# Patient Record
Sex: Male | Born: 1935 | Race: White | Hispanic: No | Smoking: Former smoker
Health system: Southern US, Community
[De-identification: ages and names within clinical notes are randomized; demographics above are authoritative.]

## PROBLEM LIST (undated history)

## (undated) DIAGNOSIS — M1990 Unspecified osteoarthritis, unspecified site: Secondary | ICD-10-CM

## (undated) DIAGNOSIS — N400 Enlarged prostate without lower urinary tract symptoms: Secondary | ICD-10-CM

## (undated) DIAGNOSIS — I219 Acute myocardial infarction, unspecified: Secondary | ICD-10-CM

## (undated) DIAGNOSIS — R251 Tremor, unspecified: Secondary | ICD-10-CM

## (undated) DIAGNOSIS — F419 Anxiety disorder, unspecified: Secondary | ICD-10-CM

## (undated) DIAGNOSIS — F0390 Unspecified dementia without behavioral disturbance: Secondary | ICD-10-CM

## (undated) HISTORY — PX: CHOLECYSTECTOMY: SHX55

## (undated) HISTORY — PX: SKIN BIOPSY: SHX1

## (undated) HISTORY — DX: Anxiety disorder, unspecified: F41.9

## (undated) HISTORY — DX: Unspecified osteoarthritis, unspecified site: M19.90

## (undated) HISTORY — PX: OTHER SURGICAL HISTORY: SHX169

## (undated) HISTORY — DX: Acute myocardial infarction, unspecified: I21.9

## (undated) HISTORY — DX: Tremor, unspecified: R25.1

## (undated) HISTORY — DX: Unspecified dementia, unspecified severity, without behavioral disturbance, psychotic disturbance, mood disturbance, and anxiety: F03.90

## (undated) HISTORY — DX: Benign prostatic hyperplasia without lower urinary tract symptoms: N40.0

---

## 2004-03-07 ENCOUNTER — Other Ambulatory Visit: Payer: Self-pay

## 2004-03-07 ENCOUNTER — Inpatient Hospital Stay: Payer: Self-pay | Admitting: Internal Medicine

## 2004-03-08 ENCOUNTER — Other Ambulatory Visit: Payer: Self-pay

## 2004-04-29 ENCOUNTER — Ambulatory Visit: Payer: Self-pay | Admitting: Internal Medicine

## 2005-04-23 ENCOUNTER — Ambulatory Visit: Payer: Self-pay | Admitting: Rheumatology

## 2005-05-02 ENCOUNTER — Ambulatory Visit: Payer: Self-pay | Admitting: Internal Medicine

## 2005-12-01 ENCOUNTER — Ambulatory Visit: Payer: Self-pay | Admitting: Internal Medicine

## 2005-12-08 ENCOUNTER — Ambulatory Visit: Payer: Self-pay | Admitting: Internal Medicine

## 2005-12-23 ENCOUNTER — Ambulatory Visit: Payer: Self-pay | Admitting: Internal Medicine

## 2006-02-18 ENCOUNTER — Ambulatory Visit: Payer: Self-pay | Admitting: Internal Medicine

## 2006-02-22 ENCOUNTER — Ambulatory Visit: Payer: Self-pay | Admitting: Internal Medicine

## 2006-04-22 ENCOUNTER — Ambulatory Visit: Payer: Self-pay | Admitting: Internal Medicine

## 2006-04-24 ENCOUNTER — Ambulatory Visit: Payer: Self-pay | Admitting: Internal Medicine

## 2008-01-05 ENCOUNTER — Emergency Department: Payer: Self-pay | Admitting: Emergency Medicine

## 2008-01-05 ENCOUNTER — Other Ambulatory Visit: Payer: Self-pay

## 2008-05-30 ENCOUNTER — Ambulatory Visit: Payer: Self-pay | Admitting: Family Medicine

## 2008-05-30 DIAGNOSIS — F411 Generalized anxiety disorder: Secondary | ICD-10-CM | POA: Insufficient documentation

## 2008-05-31 ENCOUNTER — Encounter (INDEPENDENT_AMBULATORY_CARE_PROVIDER_SITE_OTHER): Payer: Self-pay | Admitting: *Deleted

## 2008-05-31 ENCOUNTER — Encounter: Payer: Self-pay | Admitting: Family Medicine

## 2008-06-05 ENCOUNTER — Encounter: Payer: Self-pay | Admitting: Family Medicine

## 2008-06-26 ENCOUNTER — Encounter: Payer: Self-pay | Admitting: Family Medicine

## 2009-01-14 ENCOUNTER — Ambulatory Visit: Payer: Self-pay | Admitting: Specialist

## 2009-06-25 ENCOUNTER — Ambulatory Visit: Payer: Self-pay | Admitting: Family Medicine

## 2009-06-25 DIAGNOSIS — G252 Other specified forms of tremor: Secondary | ICD-10-CM

## 2009-06-25 DIAGNOSIS — R030 Elevated blood-pressure reading, without diagnosis of hypertension: Secondary | ICD-10-CM

## 2009-06-25 DIAGNOSIS — G250 Essential tremor: Secondary | ICD-10-CM

## 2009-06-25 DIAGNOSIS — M159 Polyosteoarthritis, unspecified: Secondary | ICD-10-CM | POA: Insufficient documentation

## 2009-09-10 ENCOUNTER — Ambulatory Visit: Payer: Self-pay | Admitting: Family Medicine

## 2009-10-29 ENCOUNTER — Ambulatory Visit: Payer: Self-pay | Admitting: Family Medicine

## 2009-12-31 ENCOUNTER — Encounter (INDEPENDENT_AMBULATORY_CARE_PROVIDER_SITE_OTHER): Payer: Self-pay | Admitting: *Deleted

## 2010-03-12 ENCOUNTER — Ambulatory Visit: Payer: Self-pay | Admitting: Family Medicine

## 2010-06-21 ENCOUNTER — Emergency Department: Payer: Self-pay | Admitting: Emergency Medicine

## 2010-06-21 ENCOUNTER — Encounter: Payer: Self-pay | Admitting: Family Medicine

## 2010-06-24 NOTE — Assessment & Plan Note (Signed)
Summary: 6 MTH FU/CLE   Vital Signs:  Patient profile:   77 year old male Weight:      155 pounds Temp:     97.6 degrees F oral Pulse rate:   84 / minute Pulse rhythm:   regular BP sitting:   140 / 80  (left arm) Cuff size:   regular  Vitals Entered By: Sydell Axon LPN (June 25, 2009 2:32 PM) CC: 6 Month follow-up   History of Present Illness: Pt here for a six month followup, having driven here and almost gotten lost due to forgetting the way. He admits memory is a challenge for him and he somewhat limits his driving but also redily admits that he is not ready to give up the independence of driving.  He otherwise has no complaints except for the chronic ess tremor he has had from before his wife died many years ago. He would like to try medication for that as as far as he knows, he has never been tried on any medication.  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 1 Tablet Twice A Day By Mouth 2)  Detrol La 4 Mg Xr24h-Cap (Tolterodine Tartrate) .Marland Kitchen.. 1 Daily By Mouth 3)  Flomax 0.4 Mg Xr24h-Cap (Tamsulosin Hcl) .Marland Kitchen.. 1 Daily By Mouth 4)  Lodine .... 400mg   Twice A Day  Allergies: No Known Drug Allergies  Physical Exam  General:  Well-developed,well-nourished,in no acute distress; alert,appropriate and cooperative throughout examination Head:  Normocephalic and atraumatic without obvious abnormalities. No apparent alopecia or balding. Eyes:  Conjunctiva clear bilaterally.  Ears:  External ear exam shows no significant lesions or deformities.  Otoscopic examination reveals clear canals, tympanic membranes are intact bilaterally without bulging, retraction, inflammation or discharge. Hearing is grossly normal bilaterally. Nose:  Septum  deviated slightly left with narrowed nares bilat L>R. Clear of  mucous. Mouth:  Oral mucosa and oropharynx without lesions or exudates.  Teeth in good repair. Neck:  No deformities, masses, or tenderness noted. Chest Wall:  No deformities, masses, tenderness or gynecomastia noted. Lungs:  Normal respiratory effort, chest expands symmetrically. Lungs are clear to auscultation, no crackles or wheezes. Heart:  Normal rate and regular rhythm. S1 and S2 normal without gallop, murmur, click, rub or other extra sounds. Neurologic:  Inattention tremor bilat L>R.   Impression & Recommendations:  Problem # 1:  ELEVATED BP READING WITHOUT DX HYPERTENSION (ICD-796.2) Assessment New  Had elevated BP last time as well. Stop Lodine to see if could be causing Elevation.   BP today: 140/80 Prior BP: 160/80 (05/30/2008)  Instructed in low sodium diet (DASH Handout) and behavior modification.    Problem # 2:  OSTEOARTHRITIS, MULTIPLE JOINTS (ICD-715.89) Assessment: Unchanged  Since stopping Etodolac, try Tyl ES 2 three times a day regularly. The following medications were removed from the medication list:    Etodolac 400 Mg Tabs (Etodolac) .Marland Kitchen... Take one by mouth two times a day His updated medication list for this problem includes:    Tylenol Extra Strength 500 Mg Tabs (Acetaminophen) .Marland Kitchen... 2 tabs by mouth three times a day  Discussed use of medications, application of heat or cold, and exercises.   Problem # 3:  TREMOR, ESSENTIAL (ICD-333.1) Assessment: Unchanged Trial of Sinemet. Will start  low.Marland Kitchen..10/100 two times a day.  Problem # 4:  DEMENTIA (ICD-294.8) Assessment: Unchanged Discussed my suggestion of stopping driving which he is not willing to do. Will try medication next time if all else stable.  Complete Medication List: 1)  Alprazolam 0.25 Mg Tabs (Alprazolam) .Marland Kitchen.. 1 tablet twice a day by mouth 2)  Detrol La 4 Mg Xr24h-cap (Tolterodine tartrate) .Marland Kitchen.. 1 daily by mouth 3)  Flomax 0.4  Mg Xr24h-cap (Tamsulosin hcl) .Marland Kitchen.. 1 daily by mouth 4)  Cetirizine Hcl 10 Mg Tabs (Cetirizine hcl) .... As needed 5)  Sinemet 10-100 Mg Tabs (Carbidopa-levodopa) .... One tab by mouth two times a day, may incr to three times a day if needed. 6)  Tylenol Extra Strength 500 Mg Tabs (Acetaminophen) .... 2 tabs by mouth three times a day  Patient Instructions: 1)  RTC 2 mos for recheck. 2)  Stop Lodine to see if BP improves.  3)  Start Tyl ES 2 tabs three times a day for arthritis. 4)  Start Sinemet 10/100 for Tremor. Prescriptions: SINEMET 10-100 MG TABS (CARBIDOPA-LEVODOPA) one tab by mouth two times a day, may incr to three times a day if needed.  #90 x 12   Entered and Authorized by:   Shaune Leeks MD   Signed by:   Shaune Leeks MD on 06/25/2009   Method used:   Electronically to        [facility] [phone]* (retail)       [address] [place], [place]  [address]       Ph: [phone]       Fax: [phone]   RxID:   [id number]   Current Allergies (reviewed today): No known allergies

## 2010-06-24 NOTE — Letter (Signed)
Summary: Nadara Eaton letter  [place] [facility]  [address] [place], [place] [address]   Phone: [phone]  Fax: [phone]       12/31/2009 MRN: [id number]  [facility] [address] [place], [place]  [address]  Dear Mr. [facility],  [facility] [place], and [facility] announce the retirement of Arta Silence, M.D., from full-time practice at the [facility] office effective November 21, 2009 and his plans of returning part-time.  It is important to Dr. Hetty Ely and to our practice that you understand that [facility] has seven physicians in our office for your health care needs.  We will continue to offer the same exceptional care that you have today.    Dr. Hetty Ely has spoken to many of you about his plans for retirement and returning part-time in the fall.   We will continue to work with you through the transition to schedule appointments for you in the office and meet the high standards that [facility] is committed to.   Again, it is with great pleasure that we share the news that Dr. Hetty Ely will return to [facility] in October of 2011 with a reduced schedule.    If you have any questions, or would like to request an appointment with one of our physicians, please call us at [phone] and press the option for Scheduling an appointment.  We take pleasure in providing you with excellent patient care and look forward to seeing you at your next office visit.  Our [facility] Physicians are:  Tillman Abide, M.D. Laurita Quint, M.D. Roxy Manns, M.D. Kerby Nora, M.D. Hannah Beat, M.D. Ruthe Mannan, M.D. We proudly welcomed Raechel Ache, M.D. and Eustaquio Boyden, M.D. to the practice in July/August 2011.  Sincerely,  [facility]

## 2010-06-24 NOTE — Assessment & Plan Note (Signed)
Summary: F/U/CLE   Vital Signs:  Patient profile:   77 year old male Weight:      159.25 pounds Temp:     97.0 degrees F oral Pulse rate:   88 / minute Pulse rhythm:   irregular BP sitting:   120 / 68  (left arm) Cuff size:   regular  Vitals Entered By: Sydell Axon LPN (March 12, 2010 11:57 AM) CC: follow-up visit   History of Present Illness: Pt here for recheck. He realizes he needs a hearing aid and drops and breaks them easily and theyt are expensive and can't afford to replace. He is looking into a place in [place] that helps people afford aids better. Hearing is his biggesst problem. Tremor is a nuisance but he deals with it well. His is the attention variety so signing his name is somewhat of a problem. He deals with it ok. He is still active. He still drives but emphasizes that he is very careful.  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 1 Daily By Mouth 2)  Flomax 0.4 Mg Xr24h-Cap (Tamsulosin Hcl) .Marland Kitchen.. 1 Daily By Mouth 3)  Cetirizine Hcl 10 Mg Tabs (Cetirizine Hcl) .... As Needed 4)  Tylenol Extra Strength 500 Mg Tabs (Acetaminophen) .... 2 Tabs By Mouth Three Times A Day 5)  Etodolac 400 Mg Tabs (Etodolac) .... One Tab By Mouth Two Times A Day  Allergies: 1)  ! Penicillin V Potassium (Penicillin V Potassium)  Physical Exam  General:  Well-developed,well-nourished,in no acute distress; alert,appropriate and cooperative throughout examination Head:  Normocephalic and atraumatic without obvious abnormalities. No apparent alopecia or balding. Eyes:  Conjunctiva clear  bilaterally.  Ears:  External ear exam shows no significant lesions or deformities.  Otoscopic examination reveals clear canals, tympanic membranes are intact bilaterally without bulging, retraction, inflammation or discharge. Hearing is grossly normal bilaterally. Nose:  Septum deviated slightly left with narrowed nares bilat L>R. Clear of  mucous. Mouth:  Oral mucosa and oropharynx without lesions or exudates.  Teeth in good repair. Neck:  No deformities, masses, or tenderness noted. Chest Wall:  No deformities, masses, tenderness or gynecomastia noted. Lungs:  Normal respiratory effort, chest expands symmetrically. Lungs are clear to auscultation, no crackles or wheezes. Heart:  Normal rate and regular rhythm. S1 and S2 normal without gallop, murmur, click, rub or other extra sounds. Abdomen:  Bowel sounds positive,abdomen soft and non-tender without masses, organomegaly or hernias noted. Neurologic:  Inattention tremor bilat L>R. Minimal today. Gait nml but slow. Psych:  Cognition and judgment appear intact. Alert and cooperative with normal attention span and concentration. No apparent delusions, illusions, hallucinations   Impression & Recommendations:  Problem # 1:  ELEVATED BP READING WITHOUT DX HYPERTENSION (ICD-796.2) Assessment Unchanged Conts nml. BP today: 120/68 Prior BP: 120/60 (10/29/2009)  Instructed in low sodium diet (DASH Handout) and behavior modification.    Problem # 2:  TREMOR, ESSENTIAL (ICD-333.1) Assessment: Unchanged Stable off Sinemet which made him drowsy.  Problem # 3:  DEMENTIA (ICD-294.8) Assessment: Unchanged Realizes he doesn't remember things and has a system for noting dates and appts.  Problem # 4:  BENIGN PROSTATIC HYPERTROPHY, HX OF (ICD-V13.8) Assessment: Unchanged Urination well controlled. Cont curr meds.  Complete Medication List: 1)  Detrol La 4 Mg Xr24h-cap (Tolterodine tartrate) .Marland Kitchen.. 1 daily by mouth 2)  Flomax 0.4 Mg Xr24h-cap  (Tamsulosin hcl) .Marland Kitchen.. 1 daily by mouth 3)  Cetirizine Hcl 10 Mg Tabs (Cetirizine hcl) .... As needed 4)  Tylenol Extra Strength 500 Mg Tabs (Acetaminophen) .... 2 tabs by mouth three times a day 5)  Etodolac 400 Mg Tabs (Etodolac) .... One tab by mouth two times a day  Other Orders: Flu Vaccine 61yrs + MEDICARE PATIENTS (Y6063) Administration Flu vaccine - MCR (K1601)  Patient Instructions: 1)  RTC  6 mos, fasting labs prior.   Orders Added: 1)  Est. Patient Level III [09323] 2)  Flu Vaccine 64yrs + MEDICARE PATIENTS [Q2039] 3)  Administration Flu vaccine - MCR [G0008]    Current Allergies (reviewed today): ! PENICILLIN V POTASSIUM (PENICILLIN V POTASSIUM)   Flu Vaccine Consent Questions     Do you have a history of severe allergic reactions to this vaccine? no    Any prior history of allergic reactions to egg and/or gelatin? no    Do you have a sensitivity to the preservative Thimersol? no    Do you have a past history of Guillan-Barre Syndrome? no    Do you currently have an acute febrile illness? no    Have you ever had a severe reaction to latex? no    Vaccine information given and explained to patient? yes    Are you currently pregnant? no    Lot Number:[id number]   Exp Date:11/22/2010   Site Given  Left Deltoid [email or web address]

## 2010-06-24 NOTE — Assessment & Plan Note (Signed)
Summary: RESCHEDULE 4/5 APPT/DLO   Vital Signs:  Patient profile:   77 year old male Weight:      154.75 pounds Temp:     98.1 degrees F oral Pulse rate:   80 / minute Pulse rhythm:   irregular BP sitting:   120 / 60  (left arm) Cuff size:   regular  Vitals Entered By: Sydell Axon LPN (September 10, 2009 11:37 AM) CC: Follow-up on medication, medication is making him sleep a lot   History of Present Illness: Pt here because he is sleeping a lot. He thinks this is consistent with when he started taking the Sinemet for tremor. He also does not think his tremor is much better and is not inclined to continue the medication.  He is very active at home and cannot afford to be abler to do the normal work of keeping his house up because of somnolence. He otherwise is doing well and feels good. He is very active and does alll of the work around the house including cooking and cleaning.  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 1 Tablet Twice A Day By Mouth 2)  Detrol La 4 Mg Xr24h-Cap (Tolterodine Tartrate) .Marland Kitchen.. 1 Daily By Mouth 3)  Flomax 0.4 Mg Xr24h-Cap (Tamsulosin Hcl) .Marland Kitchen.. 1 Daily By Mouth 4)  Cetirizine Hcl 10 Mg Tabs (Cetirizine Hcl) .... As Needed 5)  Sinemet 10-100 Mg Tabs (Carbidopa-Levodopa) .... One Tab By Mouth Two Times A Day, May Incr To Three Times A Day If Needed. 6)  Tylenol Extra Strength 500 Mg Tabs (Acetaminophen) .... 2 Tabs By Mouth Three Times A Day  Allergies (verified): 1)  ! Penicillin V Potassium (Penicillin V Potassium)  Physical Exam  General:   Well-developed,well-nourished,in no acute distress; alert,appropriate and cooperative throughout examination Head:  Normocephalic and atraumatic without obvious abnormalities. No apparent alopecia or balding. Eyes:  Conjunctiva clear bilaterally.  Ears:  External ear exam shows no significant lesions or deformities.  Otoscopic examination reveals clear canals, tympanic membranes are intact bilaterally without bulging, retraction, inflammation or discharge. Hearing is grossly normal bilaterally. Nose:  Septum deviated slightly left with narrowed nares bilat L>R. Clear of  mucous. Mouth:  Oral mucosa and oropharynx without lesions or exudates.  Teeth in good repair. Neck:  No deformities, masses, or tenderness noted. Chest Wall:  No deformities, masses, tenderness or gynecomastia noted. Lungs:  Normal respiratory effort, chest expands symmetrically. Lungs are clear to auscultation, no crackles or wheezes. Heart:  Normal rate and regular rhythm. S1 and S2 normal without gallop, murmur, click, rub or other extra sounds.   Impression & Recommendations:  Problem # 1:  SOMNOLENCE (ICD-780.09) Assessment New Presumed due to Sinemet both temporally and side effect-wise. Will have him taper off over one week.  Let me know if sxs don't resolve.  Complete Medication List: 1)  Alprazolam 0.25 Mg Tabs (Alprazolam) .Marland Kitchen.. 1 tablet twice a day by mouth 2)  Detrol La 4 Mg Xr24h-cap (Tolterodine tartrate) .Marland Kitchen.. 1 daily by mouth 3)  Flomax 0.4 Mg Xr24h-cap (Tamsulosin hcl) .Marland Kitchen.. 1 daily by mouth 4)  Cetirizine Hcl 10 Mg  Tabs (Cetirizine hcl) .... As needed 5)  Sinemet 10-100 Mg Tabs (Carbidopa-levodopa) .... One tab by mouth two times a day, may incr to three times a day if needed. 6)  Tylenol Extra Strength 500 Mg Tabs (Acetaminophen) .... 2 tabs by mouth three times a day  Patient Instructions: 1)  RTC as needed.  Current Allergies (reviewed today): ! PENICILLIN V POTASSIUM (PENICILLIN V POTASSIUM)

## 2010-06-24 NOTE — Assessment & Plan Note (Signed)
Summary: [name] [name] P/RBH   Vital Signs:  Patient profile:   77 year old male Weight:      157.75 pounds BMI:     22.08 Temp:     97.4 degrees F oral Pulse rate:   96 / minute Pulse rhythm:   irregular BP sitting:   120 / 60  (left arm) Cuff size:   regular  Vitals Entered By: Sydell Axon LPN (October 29, 9561 12:11 PM) CC: Follow-up, tried to review medications and patient is not sure what he is taking now   History of Present Illness: Pt here for three month followup. He has stopped taking Sinemet due to sleepiness. He is not sure if it helped anyway. He still has it at home, "in case I need it."  but was told to throw it away as I do not want him taking it occas, esp if it makes him sleepy.  He has no complaints today andf feels well. He is very active and takes care of his home himself. He pruned 10 grapevines this AM before coming in here.\par We discussed driving and I told him I do not think he shoukld be driving. He "is very careful" but my concern is reaction time and we discussed that at length. The bottom line is he will conrt driving as he does not want "to bother' his children. He says he does not drive much and I believe that but it is a long way to come here from his [place] on [address], which we discussed.  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 1 Tablet Twice A Day By Mouth 2)  Detrol La 4 Mg Xr24h-Cap (Tolterodine Tartrate) .Marland Kitchen.. 1 Daily By Mouth 3)  Flomax 0.4 Mg Xr24h-Cap (Tamsulosin Hcl) .Marland Kitchen.. 1 Daily By Mouth 4)  Cetirizine Hcl 10 Mg Tabs (Cetirizine Hcl) .... As Needed 5)  Sinemet  10-100 Mg Tabs (Carbidopa-Levodopa) .... One Tab By Mouth Two Times A Day, May Incr To Three Times A Day If Needed. 6)  Tylenol Extra Strength 500 Mg Tabs (Acetaminophen) .... 2 Tabs By Mouth Three Times A Day  Allergies: 1)  ! Penicillin V Potassium (Penicillin V Potassium)  Physical Exam  General:  Well-developed,well-nourished,in no acute distress; alert,appropriate and cooperative throughout examination Head:  Normocephalic and atraumatic without obvious abnormalities. No apparent alopecia or balding. Eyes:  Conjunctiva clear bilaterally.  Ears:  External ear exam shows no significant lesions or deformities.  Otoscopic examination reveals clear canals, tympanic membranes are intact bilaterally without bulging, retraction, inflammation or discharge. Hearing is grossly normal bilaterally. Nose:  Septum deviated slightly left with narrowed nares bilat L>R. Clear of  mucous. Mouth:  Oral mucosa and oropharynx without lesions or exudates.  Teeth in good repair. Neck:  No deformities, masses, or tenderness noted. Lungs:  Normal respiratory effort, chest expands symmetrically. Lungs are clear to auscultation, no crackles or wheezes. Heart:  Normal rate and regular rhythm. S1 and S2 normal without gallop, murmur, click, rub or other extra sounds. Abdomen:  Bowel sounds positive,abdomen soft and non-tender without masses, organomegaly or hernias noted. Neurologic:  Inattention tremor bilat  L>R. Minimal today. Gait nml but slow. Psych:  Cognition and judgment appear intact. Alert and cooperative with normal attention span and concentration. No apparent delusions, illusions, hallucinations   Impression & Recommendations:  Problem # 1:  TREMOR, ESSENTIAL (ICD-333.1) Assessment Unchanged Stable and pretty good today on no medication. Cont as is.  Problem # 2:  ELEVATED BP READING WITHOUT DX HYPERTENSION (ICD-796.2) Assessment: Unchanged  Stable today. Will cont to follow.  BP today:  120/60 Prior BP: 120/60 (09/10/2009)  Instructed in low sodium diet (DASH Handout) and behavior modification.    Problem # 3:  DEMENTIA (ICD-294.8) Assessment: Unchanged Admits to slowing down but not much else. Will continue to follow. Had long discussion about driving and decreased rection times and danger tohimself and others. Repeatedly says he is very careful, gives full attention when driving and really doeasn't drive much or very far.  Problem # 4:  BENIGN PROSTATIC HYPERTROPHY, HX OF (ICD-V13.8) Assessment: Unchanged On Flomax. Cont as it seems to be helping him. Is also on Detrol, presumably for urge incontinence.  Problem # 5:  OSTEOARTHRITIS, MULTIPLE JOINTS (ICD-715.89) Assessment: Unchanged Is on Etodolac regularly at two times a day dosing and has been for many years. "This really helps me."  We discussed this at great length as a danger to his stomach and kidneys. I asked him to decrease to at least once a day and augment with Tyl. He understood but question is if he will remember. His updated medication list for this problem includes:    Tylenol Extra Strength 500 Mg Tabs (Acetaminophen) .Marland Kitchen... 2 tabs by mouth three times a day    Etodolac 400 Mg Tabs (Etodolac) ..... One tab by mouth two times a day  Problem # 6:  ANXIETY (ICD-300.00) Assessment: Unchanged Seems well controlled and says he is not taking Xanax.  The following medications were removed from the medication list:    Alprazolam 0.25 Mg Tabs (Alprazolam) .Marland Kitchen... 1 tablet twice a day by mouth  Complete Medication List: 1)  Detrol La 4 Mg Xr24h-cap (Tolterodine tartrate) .Marland Kitchen.. 1 daily by mouth 2)  Flomax 0.4 Mg Xr24h-cap (Tamsulosin hcl) .Marland Kitchen.. 1 daily by mouth 3)  Cetirizine Hcl 10 Mg Tabs (Cetirizine hcl) .... As needed 4)  Tylenol Extra Strength 500 Mg Tabs (Acetaminophen) .... 2 tabs by mouth three times a day 5)  Etodolac 400 Mg Tabs (Etodolac) .... One tab by mouth two times a day  Patient Instructions: 1)   Call for appt when needed. Will see either Dr Para March or Dr Sharen Hones. 2)  Asked him to bring his medication bottles with hiom the next time he comes.  Current Allergies (reviewed today): ! PENICILLIN V POTASSIUM (PENICILLIN V POTASSIUM)

## 2010-06-26 ENCOUNTER — Encounter: Payer: Self-pay | Admitting: Family Medicine

## 2010-06-26 ENCOUNTER — Ambulatory Visit (INDEPENDENT_AMBULATORY_CARE_PROVIDER_SITE_OTHER): Payer: Medicare PPO | Admitting: Family Medicine

## 2010-06-26 DIAGNOSIS — F411 Generalized anxiety disorder: Secondary | ICD-10-CM

## 2010-06-26 DIAGNOSIS — R030 Elevated blood-pressure reading, without diagnosis of hypertension: Secondary | ICD-10-CM

## 2010-06-26 DIAGNOSIS — F068 Other specified mental disorders due to known physiological condition: Secondary | ICD-10-CM

## 2010-06-30 ENCOUNTER — Telehealth: Payer: Self-pay | Admitting: Family Medicine
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
## 2010-07-10 ENCOUNTER — Emergency Department: Payer: Self-pay | Admitting: Emergency Medicine

## 2010-07-10 NOTE — Letter (Signed)
Summary: Dementia Screening  Dementia Screening   Imported By: Kassie Mends 07/02/2010 11:15:43  _____________________________________________________________________  External Attachment:    Type:   Image     Comment:   External Document  Appended Document: Dementia Screening Discussed with pt and daughter at time of exam.

## 2010-07-10 NOTE — Progress Notes (Signed)
Summary: regarding sertraline  Phone Note Call from Patient   Caller: daughter in lawLupita Leash   [phone] Summary of Call: Pt was seen last week and was told sertraline would be sent to pharmacy, it was not and daughter in law is asking if this can be sent in.  Uses [facility] [address]. Initial call taken by: Lowella Petties CMA, AAMA,  June 30, 2010 11:02 AM  Follow-up for Phone Call        Please let them know I'm sorry. Script sent in.    New/Updated Medications: SERTRALINE HCL 50 MG TABS (SERTRALINE HCL) 1/2 tab by mouth daily for 6 days then 1 tab daily Prescriptions: SERTRALINE HCL 50 MG TABS (SERTRALINE HCL) 1/2 tab by mouth daily for 6 days then 1 tab daily  #30 x 12   Entered and Authorized by:   Shaune Leeks MD   Signed by:   Shaune Leeks MD on 06/30/2010   Method used:   Electronically to        [facility] [address] [phone].* (retail)       [address]       [place], [place]  [address]       Ph: [phone]       Fax: [phone]   RxID:   [id number]

## 2010-07-15 ENCOUNTER — Ambulatory Visit (INDEPENDENT_AMBULATORY_CARE_PROVIDER_SITE_OTHER): Payer: Medicare PPO | Admitting: Family Medicine

## 2010-07-15 ENCOUNTER — Encounter: Payer: Self-pay | Admitting: Family Medicine

## 2010-07-15 DIAGNOSIS — F068 Other specified mental disorders due to known physiological condition: Secondary | ICD-10-CM

## 2010-07-15 DIAGNOSIS — F411 Generalized anxiety disorder: Secondary | ICD-10-CM

## 2010-07-22 NOTE — Assessment & Plan Note (Addendum)
Summary: acute per dee   Vital Signs:  Patient profile:   77 year old male Weight:      149 pounds O2 Sat:      97 % on Room air Pulse rate:   80 / minute Pulse rhythm:   regular BP sitting:   138 / 60  (left arm) Cuff size:   regular  Vitals Entered By: [name] [name] [name] CMA (AAMA) (July 15, 2010 11:04 AM)  O2 Flow:  Room air CC: Confused, Back Pain Comments Patient walked in wanting "nerve pills". He was very confused per [name] [name] [name]. Son has been called for transportation.    History of Present Illness: CC: "nervous"  [name] [name] [name] is a pleasant 77yo with some h/o dementia as well as OA and tremor who presented today to clinic alone, with questions about a "nerve pill".  RN concerned about him so I was asked to see pt.  hard of hearing at baseline.  "under the weather for last 2-3 weeks"  Recently in hospital.  Feeling nervous, loosing weight, easily forgets things.  appetite down, but did eat oatmeal this morning.  Has lost 10 lbs.  seems to only be eating oatmeal.  Lives alone but 2 sons are neighbors and one of them spends the night every night [name] [name] [name]).  No fevers/chils, abd pain, no dysuria, frequency.  Overall "losing thinking".  Trouble reading at close up but states can see well enough to drive.  Came in today for refills of "nerve pills" doesn't think has started taking any [name] medcine.  Unsure if [name] [name] [name] (daughter in Social worker) or son [name] [name] [name]) picked them up.  not quite sure how he's taking medicines and can't tell 2/2 trouble reading instructions.  Should be getting hearing aid tomorrow.  Actually, son, [name] [name] [name] who is POA, called by front office staff and he came by today as well.  He was also unsure about medications Even Budlong is supposed to be taking.  Last visit [name] [name] [name] came in with other daughter-in-law [name] [name] [name]).  [name] [name] [name] does endorse some difference of opinion between family members when it comes to [name] [name] [name]'s driving.  Other son thinks should stop driving, and has taken away keys in  past.  [name] [name] [name] feels dad should stop driving, but not willing to take away keys and argue with father, still feels needs to respect his wishes.  Son having tough time reconciling different roles he has to play. [name] [name] [name] very adamant about continued driving and feels if this is taken away he will enter into even greater depression.  pt states doesn't get on highway and only driving during "emergencies" rather than call sons   Allergies: 1)  ! Penicillin V Potassium (Penicillin V Potassium)  Past History:  Past Surgical History: Last updated: 05/30/2008 Previous ENT surgery, presumably Septoplasty.  Past Medical History: tremor osteoarthritis anxiety dementia  Social History: lives alone, 2 sons as neighbors.  Review of Systems       per HPI  Physical Exam  General:  Well-developed,well-nourished,in no acute distress; alert,appropriate and cooperative throughout examination, hard of hearing, trouble reading. Psych:  Cognition and judgment appear intact. Alert and cooperative with normal attention span and concentration. No apparent delusions, illusions, hallucinations.  blunted affect.  somewhat nervous.     Impression & Recommendations:  Problem # 1:  DEMENTIA (ICD-294.8) First time meeting patient.  expecting hearing aid tomorrow, should help some. looks like some discussion about starting aricept for dementia at last visit, however [name] [name] [name]) does not  know about that and there hasn't seemed to be much family communication in this regards.   Discussed at length safest thing for pt and those around him is for [name] [name] [name] to stop driving, despite [name] [name] reservations on this.  advised I would like this discussed as family and have them reach unified decision which will hopefully make it easier to approach father with decision. [name] [name] [name] says he will drive father home today. consider referral to [name] for testing to see if still qualifies for driving. [name] [name] [name] would like for there to  be aid available for a few hours/day and I think temporarily this would be beneficial, so have ordered home health evaluation.  A total of 30 minutes were spent face-to-face with the patient during this encounter and over half of that time was spent on counseling and coordination of care   Orders: Home Health Referral [name])  Problem # 2:  ANXIETY (ICD-300.00)  unclear what med he has/has not been taking.  think would benefit from home health at least temporarily while get meds settled.  have placed order for [name] [name] [name] evaluation and assistance with meds.  Called pharmacy who states that sertraline was called in 06/30/2010 and filled by [name] [name] [name] on same date.  He does not remember this ,unsure where medicine is at home.  Rather than start another medicine (Aricept), will have pt start sertraline and update [place] how he's feeling.  Pt asks if he can return to see me.  May return to me or PCP in 1 month.  His updated medication list for this problem includes:    Lorazepam 0.5 Mg Tabs (Lorazepam) ..... One tablet three times a day as needed (but minimizing, goal 1/2 at night)    Sertraline Hcl 50 Mg Tabs (Sertraline hcl) .Marland Kitchen... 1/2 tab by mouth daily for 6 days then 1 tab daily  Orders: Home Health Referral (Home Health)  Complete Medication List: 1)  Detrol La 4 Mg Xr24h-cap (Tolterodine tartrate) .Marland Kitchen.. 1 daily by mouth 2)  Flomax 0.4 Mg Xr24h-cap (Tamsulosin hcl) .Marland Kitchen.. 1 daily by mouth 3)  Cetirizine Hcl 10 Mg Tabs (Cetirizine hcl) .... As needed 4)  Tylenol Extra Strength 500 Mg Tabs (Acetaminophen) .... 2 tabs by mouth three times a day 5)  Etodolac 400 Mg Tabs (Etodolac) .... One tab by mouth two times a day 6)  Lorazepam 0.5 Mg Tabs (Lorazepam) .... One tablet three times a day as needed (but minimizing, goal 1/2 at night) 7)  Sertraline Hcl 50 Mg Tabs (Sertraline hcl) .... 1/2 tab by mouth daily for 6 days then 1 tab daily  Patient Instructions: 1)  Drink plenty of fluid - you could be a bit  dehydrated. 2)  If still feeling under the weather. 3)  Safest and healthiest thing for you to do is to stop driving. 4)  Keep working on this. 5)  Check at home for sertraline, and start taking as prescribed.  Update [place] if no improvement.  If not feeling better, return to be seen. 6)  return to see [place] in 1 month for follow up. Prescriptions: SERTRALINE HCL 50 MG TABS (SERTRALINE HCL) 1/2 tab by mouth daily for 6 days then 1 tab daily  #30 x 12   Entered and Authorized by:   [name] Boyden  MD   Signed by:   [name] Boyden  MD on 07/15/2010   Method used:   Print then Give to Patient   RxID:   [id number]  Orders Added: 1)  Est. Patient Level IV [21308] 2)  Home Health Referral [Home Health]    Current Allergies (reviewed today): ! PENICILLIN V POTASSIUM (PENICILLIN V POTASSIUM)  Appended Document: acute per dee Pt and son didn't think pt had any f/u appts scheduled with Dr. Hetty Ely so asked to schedule one in 1 month.  looking at appts pt does have several scheduled with Dr. Hetty Ely.  will ask [name] Batten to call pt/family and update, notify has appt scheduled 07/29/2009 with Dr. Hetty Ely, to keep that appt and go from there.  [name] Boyden  MD  July 15, 2010 1:11 PM  Message left with "aide" about appt with Dr. Hetty Ely. She wrote appt date and time down and we cancelled the appt with Dr. Sharen Hones for right now. He will keep appt with Dr. Hetty Ely. Kim [name] CMA Duncan Dull)  July 16, 2010 10:19 AM   Clinical Lists Changes

## 2010-07-29 ENCOUNTER — Telehealth: Payer: Self-pay | Admitting: Family Medicine

## 2010-07-30 ENCOUNTER — Ambulatory Visit (INDEPENDENT_AMBULATORY_CARE_PROVIDER_SITE_OTHER): Payer: Medicare PPO | Admitting: Family Medicine

## 2010-07-30 ENCOUNTER — Encounter: Payer: Self-pay | Admitting: Family Medicine

## 2010-07-30 DIAGNOSIS — G250 Essential tremor: Secondary | ICD-10-CM

## 2010-07-30 DIAGNOSIS — F068 Other specified mental disorders due to known physiological condition: Secondary | ICD-10-CM

## 2010-07-30 DIAGNOSIS — F411 Generalized anxiety disorder: Secondary | ICD-10-CM

## 2010-07-30 DIAGNOSIS — R030 Elevated blood-pressure reading, without diagnosis of hypertension: Secondary | ICD-10-CM

## 2010-08-02 ENCOUNTER — Encounter: Payer: Self-pay | Admitting: Family Medicine

## 2010-08-02 DIAGNOSIS — F419 Anxiety disorder, unspecified: Secondary | ICD-10-CM

## 2010-08-02 DIAGNOSIS — M1990 Unspecified osteoarthritis, unspecified site: Secondary | ICD-10-CM

## 2010-08-02 DIAGNOSIS — R251 Tremor, unspecified: Secondary | ICD-10-CM | POA: Insufficient documentation

## 2010-08-02 DIAGNOSIS — F0390 Unspecified dementia without behavioral disturbance: Secondary | ICD-10-CM

## 2010-08-05 NOTE — Progress Notes (Signed)
Summary: pt being discharged from home health  Phone Note From Other Clinic   Caller: Rosey Bath with [facility]es [phone] Summary of Call: Home health aid called to report that [facility] is going to have to discharge pt because she has been to the home on 3 different occasions and the pt has not been there, he has been out driving, and therefore does not meet medicare's criteria of being homebound.  Initial call taken by: Lowella Petties CMA, AAMA,  July 29, 2010 12:35 PM  Follow-up for Phone Call        Noted. Follow-up by: Shaune Leeks MD,  July 29, 2010 6:48 PM

## 2010-08-05 NOTE — Assessment & Plan Note (Signed)
Summary: 1 month f/u jrt   Vital Signs:  Patient profile:   77 year old male Weight:      152 pounds Temp:     98.1 degrees F oral Pulse rate:   84 / minute Pulse rhythm:   regular BP sitting:   130 / 70  (left arm) Cuff size:   regular  Vitals Entered By: Sydell Axon LPN (July 29, 4780 2:17 PM) CC: One month follow-up, has tremors all the time unless holding on to something, blood in stool and rectum at times   History of Present Illness: Pt here in return for followup from time last seen one month ago after being in the ER for agitation and anxiety. I started him on Sertraline and again reiterated that I want to avoid the Lorazepam he was prescribed at the ER. He is here by himself and we discussed this, He seems to thinjk his agitation is better on the Sertraline but after significant discussion he decided he coukd use a little more medication.  His being here by himself also brought up the subject of his driving. He is adamant that he could not deal with not being able to drive and finally after much discussion he agreed to an actual driving test when his license renewal is  due in July.  His tremor is very evident, better with attention. He has had this a long time and is not interested in pursuing this at this time. We had discussed starting Dementia medication this time.  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 1 Daily By Mouth 2)  Flomax 0.4 Mg Xr24h-Cap (Tamsulosin Hcl) .Marland Kitchen.. 1 Daily By Mouth 3)  Cetirizine Hcl 10 Mg Tabs (Cetirizine Hcl) .... As Needed 4)  Tylenol Extra  Strength 500 Mg Tabs (Acetaminophen) .... 2 Tabs By Mouth Three Times A Day 5)  Etodolac 400 Mg Tabs (Etodolac) .... One Tab By Mouth Two Times A Day 6)  Lorazepam 0.5 Mg Tabs (Lorazepam) .... One Tablet Three Times A Day As Needed (But Minimizing, Goal 1/2 At Night) 7)  Sertraline Hcl 50 Mg Tabs (Sertraline Hcl) .... 1/2 Tab By Mouth Daily For 6 Days Then 1 Tab Daily  Allergies: 1)  ! Penicillin V Potassium (Penicillin V Potassium)  Physical Exam  General:  Well-developed,well-nourished,in no acute distress; alert,appropriate and cooperative throughout examination, hard of hearing, trouble reading. Head:  Normocephalic and atraumatic without obvious abnormalities. No apparent alopecia or balding. Eyes:  Conjunctiva clear bilaterally.  Ears:  External ear exam shows no significant lesions or deformities.  Otoscopic examination reveals clear canals, tympanic membranes are intact bilaterally without bulging, retraction, inflammation or discharge. Hearing is grossly normal bilaterally. Nose:  Septum deviated slightly left with narrowed nares bilat L>R. Clear of  mucous. Mouth:  Oral mucosa and oropharynx without lesions or exudates.  Teeth in good repair. Neck:  No deformities, masses, or tenderness noted. Lungs:  Normal respiratory effort, chest expands symmetrically. Lungs are clear to auscultation, no crackles or wheezes. Heart:  Normal rate and regular rhythm. S1 and S2 normal without gallop, murmur, click, rub or  other extra sounds.   Impression & Recommendations:  Problem # 1:  ANXIETY (ICD-300.00) Assessment Improved Seemingly better on Sertraline. Will increase and see him back in one month. His updated medication list for this problem includes:    Lorazepam 0.5 Mg Tabs (Lorazepam) ..... One tablet two time a day as needed (but minimizing, goal 1/2 at night)    Sertraline Hcl 100 Mg Tabs (Sertraline hcl) ..... One tab by mouth daily.  Problem # 2:  DEMENTIA (ICD-294.8) Assessment:  Unchanged If all goes well with Sertraline, will start him on Aricept next time. Then Namenda.  Problem # 3:  TREMOR, ESSENTIAL (ICD-333.1) Assessment: Unchanged Pt tolerating well and not interested in trmt.  Problem # 4:  ELEVATED BP READING WITHOUT DX HYPERTENSION (ICD-796.2) Assessment: Unchanged Stable. BP today: 130/70 Prior BP: 138/60 (07/15/2010)  Instructed in low sodium diet (DASH Handout) and behavior modification.    Complete Medication List: 1)  Detrol La 4 Mg Xr24h-cap (Tolterodine tartrate) .Marland Kitchen.. 1 daily by mouth 2)  Flomax 0.4 Mg Xr24h-cap (Tamsulosin hcl) .Marland Kitchen.. 1 daily by mouth 3)  Tylenol Extra Strength 500 Mg Tabs (Acetaminophen) .... 2 tabs by mouth three times a day 4)  Etodolac 400 Mg Tabs (Etodolac) .... One tab by mouth two times a day 5)  Lorazepam 0.5 Mg Tabs (Lorazepam) .... One tablet two time a day as needed (but minimizing, goal 1/2 at night) 6)  Sertraline Hcl 100 Mg Tabs (Sertraline hcl) .... One tab by mouth daily. 7)  Ferrous Sulfate 325 (65 Fe) Mg Tabs (Ferrous sulfate) .... Take one 2-3 times a day as needed  Patient Instructions: 1)  Increase Sertraline to 100mg  a day. 2)  Avoid Lorazepam if possible.  3)  RTC one month to resassess nervousness and poss start medications for memory. Prescriptions: SERTRALINE HCL 100 MG TABS (SERTRALINE HCL) one tab by mouth daily.  #30 x 12   Entered and Authorized by:   Shaune Leeks MD   Signed by:   Shaune Leeks MD on 07/30/2010   Method used:   Electronically to        [facility] [address] [phone].* (retail)       [address]       [place], [place]  [address]       Ph: [phone]       Fax: [phone]   RxID:   [id number]    Orders Added: 1)  Est. Patient Level IV [47425]    Current Allergies (reviewed today): ! PENICILLIN V POTASSIUM (PENICILLIN V POTASSIUM)

## 2010-08-11 ENCOUNTER — Telehealth: Payer: Self-pay | Admitting: Family Medicine

## 2010-08-12 NOTE — Miscellaneous (Signed)
Summary: [facility]   [facility]   Imported By: Kassie Mends 08/04/2010 10:18:50  _____________________________________________________________________  External Attachment:    Type:   Image     Comment:   External Document

## 2010-08-12 NOTE — Miscellaneous (Signed)
Summary: [facility]   [facility]   Imported By: Kassie Mends 08/04/2010 10:18:05  _____________________________________________________________________  External Attachment:    Type:   Image     Comment:   External Document

## 2010-08-12 NOTE — Miscellaneous (Signed)
Summary: [facility] Plan of Treatment   [facility] Plan of Treatment   Imported By: Kassie Mends 08/04/2010 10:19:48  _____________________________________________________________________  External Attachment:    Type:   Image     Comment:   External Document

## 2010-08-15 ENCOUNTER — Ambulatory Visit: Payer: Medicare PPO | Admitting: Family Medicine

## 2010-08-21 NOTE — Progress Notes (Addendum)
Summary: ETODOLAC 400 MG TABS  Phone Note Refill Request Message from:  Fax from Pharmacy on August 11, 2010 9:56 AM  Refills Requested: Medication #1:  ETODOLAC 400 MG TABS one tab by mouth two times a day [facility] [phone]. Fax is on your desk.   Initial call taken by: Melody Comas,  August 11, 2010 9:58 AM  Follow-up for Phone Call        This is a Dr Hetty Ely patient Cindee Salt MD  August 11, 2010 10:12 AM   Rx faxed to [facility].  Follow-up by: Melody Comas,  August 11, 2010 3:25 PM    New/Updated Medications: ETODOLAC 400 MG TABS (ETODOLAC) one tab by mouth two times a day sparingly and take with meals Prescriptions: ETODOLAC 400 MG TABS (ETODOLAC) one tab by mouth two times a day sparingly and take with meals  #180 x 0   Entered and Authorized by:   Shaune Leeks MD   Signed by:   Shaune Leeks MD on 08/11/2010   Method used:   Printed then faxed to ...         RxID:   [id number]   Appended Document: ETODOLAC 400 MG TABS Rx canceled with [facility] and called to [facility].

## 2010-09-03 ENCOUNTER — Ambulatory Visit: Payer: Medicare PPO | Admitting: Family Medicine

## 2010-09-07 ENCOUNTER — Other Ambulatory Visit: Payer: Self-pay | Admitting: Family Medicine

## 2010-09-07 DIAGNOSIS — R030 Elevated blood-pressure reading, without diagnosis of hypertension: Secondary | ICD-10-CM

## 2010-09-07 DIAGNOSIS — F411 Generalized anxiety disorder: Secondary | ICD-10-CM

## 2010-09-07 DIAGNOSIS — F068 Other specified mental disorders due to known physiological condition: Secondary | ICD-10-CM

## 2010-09-07 DIAGNOSIS — M159 Polyosteoarthritis, unspecified: Secondary | ICD-10-CM

## 2010-09-12 ENCOUNTER — Telehealth: Payer: Self-pay | Admitting: *Deleted

## 2010-09-12 ENCOUNTER — Other Ambulatory Visit (INDEPENDENT_AMBULATORY_CARE_PROVIDER_SITE_OTHER): Payer: Medicare PPO | Admitting: Family Medicine

## 2010-09-12 DIAGNOSIS — F411 Generalized anxiety disorder: Secondary | ICD-10-CM

## 2010-09-12 DIAGNOSIS — F068 Other specified mental disorders due to known physiological condition: Secondary | ICD-10-CM

## 2010-09-12 DIAGNOSIS — R030 Elevated blood-pressure reading, without diagnosis of hypertension: Secondary | ICD-10-CM

## 2010-09-12 DIAGNOSIS — Z79899 Other long term (current) drug therapy: Secondary | ICD-10-CM

## 2010-09-12 DIAGNOSIS — M159 Polyosteoarthritis, unspecified: Secondary | ICD-10-CM

## 2010-09-12 LAB — RENAL FUNCTION PANEL
Albumin: 3.7 g/dL (ref 3.5–5.2)
Calcium: 9 mg/dL (ref 8.4–10.5)
Creatinine, Ser: 1.4 mg/dL (ref 0.4–1.5)
Glucose, Bld: 85 mg/dL (ref 70–99)

## 2010-09-12 LAB — CBC WITH DIFFERENTIAL/PLATELET
Basophils Absolute: 0 10*3/uL (ref 0.0–0.1)
Basophils Relative: 0.8 % (ref 0.0–3.0)
Eosinophils Absolute: 0.1 10*3/uL (ref 0.0–0.7)
Eosinophils Relative: 2.4 % (ref 0.0–5.0)
HCT: 37.7 % — ABNORMAL LOW (ref 39.0–52.0)
Hemoglobin: 12.8 g/dL — ABNORMAL LOW (ref 13.0–17.0)
Lymphocytes Relative: 22.2 % (ref 12.0–46.0)
Lymphs Abs: 0.9 10*3/uL (ref 0.7–4.0)
MCHC: 34.1 g/dL (ref 30.0–36.0)
MCV: 94.3 fl (ref 78.0–100.0)
Monocytes Absolute: 0.3 10*3/uL (ref 0.1–1.0)
Monocytes Relative: 9 % (ref 3.0–12.0)
Neutro Abs: 2.5 10*3/uL (ref 1.4–7.7)
Neutrophils Relative %: 65.6 % (ref 43.0–77.0)
Platelets: 111 10*3/uL — ABNORMAL LOW (ref 150.0–400.0)
RBC: 3.99 Mil/uL — ABNORMAL LOW (ref 4.22–5.81)
RDW: 13 % (ref 11.5–14.6)
WBC: 3.9 10*3/uL — ABNORMAL LOW (ref 4.5–10.5)

## 2010-09-12 LAB — HEPATIC FUNCTION PANEL
ALT: 12 U/L (ref 0–53)
AST: 19 U/L (ref 0–37)
Bilirubin, Direct: 0.1 mg/dL (ref 0.0–0.3)
Total Bilirubin: 0.5 mg/dL (ref 0.3–1.2)

## 2010-09-12 LAB — BASIC METABOLIC PANEL
Chloride: 103 mEq/L (ref 96–112)
Creatinine, Ser: 1.4 mg/dL (ref 0.4–1.5)
GFR: 49.59 mL/min — ABNORMAL LOW (ref >60.00)

## 2010-09-12 LAB — LIPID PANEL
LDL Cholesterol: 138 mg/dL — ABNORMAL HIGH (ref 0–99)
Total CHOL/HDL Ratio: 5
Triglycerides: 91 mg/dL (ref 0.0–149.0)
VLDL: 18.2 mg/dL (ref 0.0–40.0)

## 2010-09-12 NOTE — Telephone Encounter (Signed)
Agree 

## 2010-09-12 NOTE — Telephone Encounter (Signed)
FYI Pt came for labs this morning, he stated his blood levels might be low because he had blood in his urine started 1 month ago and lasted 3 weeks. No symptoms x 1 week. He has appt 5/2 with Dr Hetty Ely. I advised him to call if symptom returns or if anything new symptoms develop.

## 2010-09-12 NOTE — Telephone Encounter (Signed)
Agree with above -- will route to Dr Hetty Ely

## 2010-09-24 ENCOUNTER — Encounter: Payer: Self-pay | Admitting: Family Medicine

## 2010-09-24 ENCOUNTER — Ambulatory Visit (INDEPENDENT_AMBULATORY_CARE_PROVIDER_SITE_OTHER): Payer: Medicare PPO | Admitting: Family Medicine

## 2010-09-24 DIAGNOSIS — F068 Other specified mental disorders due to known physiological condition: Secondary | ICD-10-CM

## 2010-09-24 DIAGNOSIS — F419 Anxiety disorder, unspecified: Secondary | ICD-10-CM

## 2010-09-24 DIAGNOSIS — R5383 Other fatigue: Secondary | ICD-10-CM

## 2010-09-24 DIAGNOSIS — R030 Elevated blood-pressure reading, without diagnosis of hypertension: Secondary | ICD-10-CM

## 2010-09-24 DIAGNOSIS — F411 Generalized anxiety disorder: Secondary | ICD-10-CM

## 2010-09-24 NOTE — Assessment & Plan Note (Signed)
Will start Namenda today as I have a sample start kit. Will consider adding Aricept next time.

## 2010-09-24 NOTE — Assessment & Plan Note (Signed)
Stable and well controlled.  BP Readings from Last 3 Encounters:  09/24/10 128/80  07/30/10 130/70  07/15/10 138/60

## 2010-09-24 NOTE — Patient Instructions (Signed)
RTC one month for recheck.,

## 2010-09-24 NOTE — Assessment & Plan Note (Signed)
Pt sure his fatigue is related to his blood in the stool which has now resolved. He has been taking iron pills since having the blood and all three lines of his CBC are low. He probably has myelodysplasia but at this point declines referral to hematology. He will continue iron therapy. His labs otherwise are reasonably unremakable. Lab Results  Component Value Date   WBC 3.9* 09/12/2010   HGB 12.8* 09/12/2010   HCT 37.7* 09/12/2010   MCV 94.3 09/12/2010   PLT 111.0* 09/12/2010

## 2010-09-24 NOTE — Assessment & Plan Note (Signed)
Doing ok altho he relates being anxious at times. Will hold off any further Sertraline at this point as will start Namenda today via start kit. Will add Aricept and think about restarting Sertraline if anxiety is still a concern.

## 2010-09-24 NOTE — Progress Notes (Signed)
  Subjective:    Patient ID: [name] [name], male    DOB: April 21, 1924, 77 y.o.   MRN: [id number]  HPI Pt here for followup of anxiety after starting on increased dose of Sertraline. Discussed last time starting Dementia medication. He is doing well.  He complains of losing blood through his bowels. It has stopped about a week ago. It was red for two mornings and then turned black for about two weeks. It stopped being black about three or four days. He does not think he is bleeding anymore. He has had this happen a few times in the past and it has always improved. He takes it easy and eats soft foods and this usually helps. "I am very conservative in what I do and do not like going to the doctor."  He started on iron pills bid when he began the red/black stools.  He had a very lightheaded episode at church a few weeks ago. I was there and I think the problem was not having eaten or drunk anything prior to going. He realizes that his memory is failing and he would like "anything that will help."    Review of Systems  Constitutional: Positive for fatigue. Negative for fever, chills, diaphoresis, activity change and appetite change.  HENT: Negative for hearing loss, ear pain, congestion, sore throat, rhinorrhea, neck pain, neck stiffness, postnasal drip, sinus pressure, tinnitus and ear discharge.   Eyes: Negative for pain, discharge and visual disturbance.  Respiratory: Negative for cough, shortness of breath and wheezing.   Cardiovascular: Negative for chest pain and palpitations.       No SOB w/ exertion  Gastrointestinal: Blood in stool: now stopped.       No heartburn or swallowing problems.  Genitourinary:       No nocturia  Skin:       No itching or dryness.  Neurological:       No tingling or balance problems.  Hematological:       Tired with minimal energy.  All other systems reviewed and are negative.       Objective:   Physical Exam  Constitutional: He appears  well-developed and well-nourished. No distress.  HENT:  Head: Normocephalic and atraumatic.  Right Ear: External ear normal.  Left Ear: External ear normal.  Nose: Nose normal.  Mouth/Throat: Oropharynx is clear and moist.  Eyes: Conjunctivae and EOM are normal. Pupils are equal, round, and reactive to light. Right eye exhibits no discharge. Left eye exhibits no discharge.  Neck: Normal range of motion. Neck supple.  Cardiovascular: Normal rate and regular rhythm.   Pulmonary/Chest: Effort normal and breath sounds normal. He has no wheezes.  Abdominal: Soft. Bowel sounds are normal. He exhibits no distension. There is no tenderness.  Lymphadenopathy:    He has no cervical adenopathy.  Skin: He is not diaphoretic.          Assessment & Plan:

## 2010-10-21 ENCOUNTER — Other Ambulatory Visit: Payer: Self-pay | Admitting: *Deleted

## 2010-10-21 MED ORDER — TOLTERODINE TARTRATE ER 4 MG PO CP24: 4.0000 mg | ORAL_CAPSULE | Freq: Every day | ORAL | Status: DC

## 2010-10-21 MED ORDER — TAMSULOSIN HCL 0.4 MG PO CAPS: 0.4000 mg | ORAL_CAPSULE | Freq: Every day | ORAL | Status: DC

## 2010-10-21 MED ORDER — ETODOLAC 400 MG PO TABS: ORAL_TABLET | ORAL | Status: DC

## 2010-10-27 ENCOUNTER — Other Ambulatory Visit: Payer: Self-pay | Admitting: *Deleted

## 2010-10-27 MED ORDER — TOLTERODINE TARTRATE ER 4 MG PO CP24: 4.0000 mg | ORAL_CAPSULE | Freq: Every day | ORAL | Status: DC

## 2010-10-27 NOTE — Telephone Encounter (Signed)
Refill sent to pharmacy.   

## 2010-10-28 ENCOUNTER — Other Ambulatory Visit: Payer: Self-pay | Admitting: *Deleted

## 2010-10-28 MED ORDER — TAMSULOSIN HCL 0.4 MG PO CAPS: 0.4000 mg | ORAL_CAPSULE | Freq: Every day | ORAL | Status: DC

## 2010-10-29 ENCOUNTER — Encounter: Payer: Self-pay | Admitting: Family Medicine

## 2010-10-29 ENCOUNTER — Ambulatory Visit (INDEPENDENT_AMBULATORY_CARE_PROVIDER_SITE_OTHER): Payer: Medicare PPO | Admitting: Family Medicine

## 2010-10-29 ENCOUNTER — Other Ambulatory Visit: Payer: Self-pay | Admitting: Family Medicine

## 2010-10-29 DIAGNOSIS — F068 Other specified mental disorders due to known physiological condition: Secondary | ICD-10-CM

## 2010-10-29 DIAGNOSIS — G250 Essential tremor: Secondary | ICD-10-CM

## 2010-10-29 DIAGNOSIS — R030 Elevated blood-pressure reading, without diagnosis of hypertension: Secondary | ICD-10-CM

## 2010-10-29 DIAGNOSIS — R5383 Other fatigue: Secondary | ICD-10-CM

## 2010-10-29 DIAGNOSIS — F411 Generalized anxiety disorder: Secondary | ICD-10-CM

## 2010-10-29 DIAGNOSIS — R5381 Other malaise: Secondary | ICD-10-CM

## 2010-10-29 MED ORDER — MEMANTINE HCL 10 MG PO TABS: 10.0000 mg | ORAL_TABLET | Freq: Two times a day (BID) | ORAL | Status: DC

## 2010-10-29 NOTE — Assessment & Plan Note (Addendum)
Seems well controlled today.  He realizes stress naturally worsens this for him.

## 2010-10-29 NOTE — Patient Instructions (Signed)
Return as needed

## 2010-10-29 NOTE — Assessment & Plan Note (Signed)
Appears baseline and I do not want to increase the Metoprolol as I might negatively impact his energy.

## 2010-10-29 NOTE — Progress Notes (Signed)
  Subjective:    Patient ID: [name] Socks Sr., male    DOB: [birth year], 77 y.o.   MRN: [id number]  HPI Pt here for recheck after starting Namenda last time for dementia.  It sounds like he has been mildly anemic for a long time as he has been turned down for giving blood, presumably from anemia. He had cut his femoral artery in the past in [place] and he was told he could not be given blood but it sounds like this was because they could not find his type (presumably my interpretation from what he is describing). He continues with mild fatigue, improved from last time. He really wants to avoid referral to anyone else. He is taking iron currently and seems to be tolerating that well. He is tolerating the Namenda well without any problem. His memory has worsened in the last year and a half altho he understands that he has been losing it over the last few years. He realizes this.  He had a heart attack in the early 90s.  His fatigue continues altho he feels better today than he has. He thinks his tremors wear him out. He is on Metoprolol for this. 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 He seems more interactive today and he says he feels the best he has felt in a while.   Review of SystemsNoncontributory except as above.       Objective:   Physical Exam  Constitutional: He appears well-developed and well-nourished. No distress.  HENT:  Head: Normocephalic and atraumatic.  Right Ear: External ear normal.  Left Ear: External ear normal.  Nose: Nose normal.  Mouth/Throat: Oropharynx is clear and moist.  Eyes: Conjunctivae and EOM are normal. Pupils are equal, round, and reactive to light. Right eye exhibits no discharge. Left eye exhibits no discharge.  Neck: Normal range of motion. Neck supple.  Cardiovascular: Normal rate and regular rhythm.   Pulmonary/Chest: Effort normal and breath sounds normal. He has no wheezes.  Lymphadenopathy:    He has no cervical  adenopathy.  Skin: He is not diaphoretic.          Assessment & Plan:

## 2010-10-29 NOTE — Telephone Encounter (Signed)
Rx called to pharmacy

## 2010-10-29 NOTE — Assessment & Plan Note (Signed)
Stable. Able to talk today with good interaction. He has significant trouble hearing which makes him appear worse than he actually is. Cont Namenda 10mg  twice a day.

## 2010-10-29 NOTE — Assessment & Plan Note (Signed)
Normal and has been for a while. Feel this is nonissue. BP Readings from Last 3 Encounters:  10/29/10 120/64  09/24/10 128/80  07/30/10 130/70

## 2010-10-29 NOTE — Telephone Encounter (Signed)
Is it okay to refill this ?  

## 2010-10-29 NOTE — Assessment & Plan Note (Addendum)
Improved. Will recheck CBC if doesn't continue to improve. His blood in the stool is better. At times he says it is resolved and at other times he says it is almost stopped. He declines GI referral for eval.

## 2010-11-10 ENCOUNTER — Other Ambulatory Visit: Payer: Self-pay | Admitting: *Deleted

## 2010-11-10 MED ORDER — TOLTERODINE TARTRATE ER 4 MG PO CP24: 4.0000 mg | ORAL_CAPSULE | Freq: Every day | ORAL | Status: DC

## 2010-11-12 ENCOUNTER — Ambulatory Visit (INDEPENDENT_AMBULATORY_CARE_PROVIDER_SITE_OTHER): Payer: Medicare PPO | Admitting: Family Medicine

## 2010-11-12 ENCOUNTER — Encounter: Payer: Self-pay | Admitting: Family Medicine

## 2010-11-12 ENCOUNTER — Encounter: Payer: Self-pay | Admitting: Gastroenterology

## 2010-11-12 VITALS — BP 142/90 | HR 85 | Temp 98.7°F | Wt 152.8 lb

## 2010-11-12 DIAGNOSIS — K921 Melena: Secondary | ICD-10-CM | POA: Insufficient documentation

## 2010-11-12 NOTE — Assessment & Plan Note (Signed)
>  25 min spent with face to face with patient, >50% counseling.  D/w pt re: ddx, which includes colon CA.  I would check labs and refer to GI.  He may be a candidate for a sigmoidoscopy instead of colonoscopy, but I'll defer to GI.  App GI help.  Pt and son agree with plan.

## 2010-11-12 NOTE — Patient Instructions (Signed)
See Shirlee Limerick about your referral before your leave today. You can get your results through our phone system.  Follow the instructions on the blue card. Don't change your meds in the meantime.   Take care.  Glad to see you today.

## 2010-11-12 NOTE — Progress Notes (Signed)
H/o BRBPR and melena, off an on.  Now melena mostly present for the last few months.  Prev d/w PMD.  No CP, not SOB.  No ble edema.  No pre/syncope.  Asking for advice. H/o iron def anemia.  Today's labs pending.  PMH and SH reviewed  ROS: See HPI, otherwise noncontributory.  Meds, vitals, and allergies reviewed.   nad ncat Mmm rrr ctab abd soft, not ttp, normal BS External hemorrhoids noted Ext w/o edema Tremor at baseline

## 2010-11-13 LAB — CBC WITH DIFFERENTIAL/PLATELET
Basophils Relative: 0.6 % (ref 0.0–3.0)
Eosinophils Absolute: 0.1 10*3/uL (ref 0.0–0.7)
HCT: 37.4 % — ABNORMAL LOW (ref 39.0–52.0)
Lymphs Abs: 0.7 10*3/uL (ref 0.7–4.0)
MCHC: 34 g/dL (ref 30.0–36.0)
MCV: 95.1 fl (ref 78.0–100.0)
Monocytes Absolute: 0.4 10*3/uL (ref 0.1–1.0)
Neutro Abs: 3.3 10*3/uL (ref 1.4–7.7)
Neutrophils Relative %: 73.7 % (ref 43.0–77.0)
RBC: 3.93 Mil/uL — ABNORMAL LOW (ref 4.22–5.81)

## 2010-11-13 LAB — IBC PANEL: Transferrin: 218.6 mg/dL (ref 212.0–360.0)

## 2010-11-19 ENCOUNTER — Encounter: Payer: Self-pay | Admitting: Gastroenterology

## 2010-11-19 ENCOUNTER — Ambulatory Visit (INDEPENDENT_AMBULATORY_CARE_PROVIDER_SITE_OTHER): Payer: Medicare PPO | Admitting: Gastroenterology

## 2010-11-19 VITALS — BP 140/60 | HR 80 | Ht 73.0 in | Wt 149.0 lb

## 2010-11-19 DIAGNOSIS — R634 Abnormal weight loss: Secondary | ICD-10-CM

## 2010-11-19 DIAGNOSIS — K921 Melena: Secondary | ICD-10-CM

## 2010-11-19 NOTE — Progress Notes (Signed)
History of Present Illness: This is an 77 year old male here today with his granddaughter. His son is power of attorney but is not available today. The patient can only provide limited history of recent events and his memory of distant events is unreliable. The patient and records relate a history of red blood per rectum on several occasions about 2 months ago and since that time he has had dark stools. He was found to have a mild normocytic anemia and he has been treated with iron for the past several weeks. His iron, B12 and folate studies were normal. He denies tarry, sticky or bloody stools but states his stools have been dark or black on a regular basis for about one month. He states he lost about 12 pounds over the past few months. He states he had a colonoscopy done many years ago, likely in [place], but he cannot recall any details. He denies change in appetite, change in bowel habits, change in stool caliber, abdominal pain, chest pain, nausea, vomiting, anorexia or early satiety.   Past Medical History  Diagnosis Date  . Tremor   . OA (osteoarthritis)   . Anxiety   . Dementia     POA is Thayer Ohm  . Hearing loss    Past Surgical History  Procedure Date  . Previous ent surgery     Presumably Septoplasty  . Cholecystectomy   . Skin biopsy     face    reports that he quit smoking about 50 years ago. He has never used smokeless tobacco. He reports that he drinks about one ounce of alcohol per week. He reports that he does not use illicit drugs. family history includes Arthritis in his sons; Breast cancer in his daughter; and Skin cancer in his mother. Allergies  Allergen Reactions  . Penicillins     REACTION: Rash   Outpatient Encounter Prescriptions as of 11/19/2010  Medication Sig Dispense Refill  . acetaminophen (TYLENOL) 500 MG tablet Take 1,000 mg by mouth 2 (two) times daily.       Marland Kitchen CALCIUM PO Take 1 capsule by mouth as needed.        . cetirizine (ZYRTEC) 10 MG  tablet Take 10 mg by mouth daily as needed.        . Cyanocobalamin (VITAMIN B-12 PO) Take 1 capsule by mouth daily.        Marland Kitchen etodolac (LODINE) 400 MG tablet Take one tablet by mouth two times daily after meals only if needed.  180 tablet  3  . ferrous sulfate 325 (65 FE) MG tablet Take 325 mg by mouth daily with breakfast.       . LORazepam (ATIVAN) 0.5 MG tablet take 1 tablet by mouth three times a day if needed  30 tablet  0  . memantine (NAMENDA) 10 MG tablet Take 1 tablet (10 mg total) by mouth 2 (two) times daily.  60 tablet  12  . Misc Natural Products (OSTEO BI-FLEX ADV DOUBLE ST PO) Take by mouth as needed.        . Nutritional Supplements (ENSURE PO) Drinks one-two per day.       . Omega-3 Fatty Acids (FISH OIL) 1000 MG CAPS Take by mouth daily.       . sertraline (ZOLOFT) 50 MG tablet Take 50 mg by mouth daily.        . Tamsulosin HCl (FLOMAX) 0.4 MG CAPS Take 1 capsule (0.4 mg total) by mouth daily.  90 capsule  3  .  tolterodine (DETROL LA) 4 MG 24 hr capsule Take 1 capsule (4 mg total) by mouth daily.  90 capsule  3   Review of Systems: Pertinent positive and negative review of systems were noted in the above HPI section. All other review of systems were otherwise negative.  Physical Exam: General: Thin elderly man, in no acute distress Head: Normocephalic and atraumatic Eyes:  sclerae anicteric, EOMI Ears: Decreased auditory acuity Mouth: No deformity or lesions Neck: Supple, no masses or thyromegaly Lungs: Clear throughout to auscultation Heart: Regular rate and rhythm; no murmurs, rubs or bruits Abdomen: Soft, non tender and non distended. No masses, hepatosplenomegaly or hernias noted. Normal Bowel sounds Rectal: No external or internal lesions. Dark greenish/black hemoccult negative stool in the rectal vault, consistent with iron effect. Musculoskeletal: Symmetrical with no gross deformities  Skin: No lesions on visible extremities Pulses:  Normal pulses  noted Extremities: No clubbing, cyanosis, edema or deformities noted Neurological: Alert oriented x 3, grossly nonfocal. Limited memory. Cervical Nodes:  No significant cervical adenopathy Inguinal Nodes: No significant inguinal adenopathy Psychological:  Alert and cooperative. Normal mood and affect  Assessment and Recommendations:  1. Hematochezia, weight loss and a normocytic anemia. His ongoing dark stools are likely secondary to iron. Consider discontinuing iron. Rule out colorectal neoplasms, hemorrhoids, AVMs, etc. Recommended to the patient and his granddaughter to schedule colonoscopy. His son who has POA will need to be here to provide informed consent. I will have the patient return for a previsit with his son to complete the scheduling and consent process.  2. Anemia and thrombocytopenia. R/O myelodysplastic syndrome. Consider stopping fe. Further evaluation per PCP.

## 2010-11-19 NOTE — Patient Instructions (Addendum)
You have been scheduled for a Colonoscopy on 12/24/10 at 10:30am. Your previsit to get your procedure instructions will be on 12/15/10 at 10:30am. Please bring all medications and insurance cards. Also make sure your son comes with your to this appointment and brings the power of attorney paperwork. cc: Crawford Givens, MD

## 2010-12-24 ENCOUNTER — Other Ambulatory Visit: Payer: Medicare PPO | Admitting: Gastroenterology

## 2011-05-14 ENCOUNTER — Encounter: Payer: Self-pay | Admitting: Family Medicine

## 2011-05-14 ENCOUNTER — Telehealth: Payer: Self-pay | Admitting: Gastroenterology

## 2011-05-14 ENCOUNTER — Ambulatory Visit (INDEPENDENT_AMBULATORY_CARE_PROVIDER_SITE_OTHER): Payer: Medicare PPO | Admitting: Family Medicine

## 2011-05-14 VITALS — BP 112/58 | HR 88 | Temp 97.8°F | Ht 73.0 in | Wt 146.2 lb

## 2011-05-14 DIAGNOSIS — Z79899 Other long term (current) drug therapy: Secondary | ICD-10-CM

## 2011-05-14 DIAGNOSIS — K921 Melena: Secondary | ICD-10-CM

## 2011-05-14 LAB — RENAL FUNCTION PANEL
Albumin: 4.5 g/dL (ref 3.5–5.2)
BUN: 27 mg/dL — ABNORMAL HIGH (ref 6–23)
CO2: 32 mEq/L (ref 19–32)
Creatinine, Ser: 1.3 mg/dL (ref 0.4–1.5)
GFR: 53.8 mL/min — ABNORMAL LOW (ref >60.00)
Phosphorus: 3.2 mg/dL (ref 2.3–4.6)

## 2011-05-14 LAB — CBC WITH DIFFERENTIAL/PLATELET
Basophils Absolute: 0 10*3/uL (ref 0.0–0.1)
HCT: 39.7 % (ref 39.0–52.0)
Lymphs Abs: 0.6 10*3/uL — ABNORMAL LOW (ref 0.7–4.0)
MCV: 95.1 fl (ref 78.0–100.0)
Monocytes Absolute: 0.4 10*3/uL (ref 0.1–1.0)
Neutro Abs: 3.4 10*3/uL (ref 1.4–7.7)
Platelets: 132 10*3/uL — ABNORMAL LOW (ref 150.0–400.0)
RDW: 13.2 % (ref 11.5–14.6)

## 2011-05-14 LAB — HEPATIC FUNCTION PANEL
ALT: 11 U/L (ref 0–53)
Albumin: 4.5 g/dL (ref 3.5–5.2)
Alkaline Phosphatase: 63 U/L (ref 39–117)
Total Protein: 6.9 g/dL (ref 6.0–8.3)

## 2011-05-14 LAB — VITAMIN B12: Vitamin B-12: 982 pg/mL — ABNORMAL HIGH (ref 211–911)

## 2011-05-14 NOTE — Patient Instructions (Addendum)
To ER if becomes unstable. Will call pt's daughter-in-law with results or put on phone tree if unable to get hold of her. She understands she may be told to take the pt to the ER after the bloodwork results become available.   Discussed essentially nml labs with [name], daughter-in law. She will start him on Prilosec bid and await hearing about GI appt. Referred to GI for eval.

## 2011-05-14 NOTE — Assessment & Plan Note (Signed)
Spoke with daughter-in-law, Tayshawn Purnell here in the office as I requested a family member to come drive him home. Normally I would be sending a pt like this to the ER but he has been resistent to this in the past so will try to initiate this as an outpt. His stool is light black and guaic positive. HE appears stable at present. Will assess CBC today and send to ER if remarkably low. His last H/H was 12.7/37.4 and stable. She will take him to the ER if she appreciates a marked change or instability in appearance. If H/H stable, will initiate PPI and refer to GI for eval. Would also have him start Fe if mildly anemic. Hold these for now in case he must go to the ER for further eval.

## 2011-05-14 NOTE — Progress Notes (Signed)
Addended by: Arta Silence on: 05/14/2011 03:31 PM   Modules accepted: Orders

## 2011-05-14 NOTE — Progress Notes (Signed)
  Subjective:    Patient ID: [name] Socks Sr., male    DOB: 08/01/1923, 77 y.o.   MRN: [id number]  HPI 77 year old patient of mine who walked in and complains of blood from the rectal area. He complains of stools being black. He has had black stools for a while and is weak. He thinks at least a month but is difficult for him to tell as he readily realizes his memory is failing as well.  He has mild dizziness with standing up. He denies abdominal pain, appetite change, constipation or diarrhea. He has not seen stool consistency to change lately but does not pay much attention to that. He has been laying around a lot lately as his energy is off but had no significant problems getting here this AM (he drove himself!!) and no difficulty walking back to the exam room. He drove here and we discussed the fact that he should not be driving. He is willing to be driven home by someone else. This is a change in attitude. He says his license is due to expire in three months and implied that he does not intend to try to renew it. He also realizes that his memory is getting worse and that he should live somewhere where he can be with others who can look out for him. This is also a change in attitude. He would be willing to go to an assisted living facility.    Review of Systems  Constitutional: Positive for fatigue. Negative for fever, chills, diaphoresis, activity change and appetite change.  HENT: Negative for hearing loss, ear pain, congestion, sore throat, rhinorrhea, neck pain, neck stiffness, postnasal drip, sinus pressure, tinnitus and ear discharge.   Eyes: Negative for pain, discharge and visual disturbance.  Respiratory: Negative for cough, shortness of breath and wheezing.   Cardiovascular: Negative for chest pain and palpitations.       No SOB w/ exertion  Gastrointestinal: Negative for nausea, vomiting, abdominal pain, diarrhea, constipation, blood in stool, abdominal distention and rectal pain.    No heartburn or swallowing problems. He has noticed his stools to be black. He let his family know last night he was loosing blood again.  Genitourinary:       No nocturia  Skin:       No itching or dryness.  Neurological: Positive for weakness.       No tingling or balance problems.  All other systems reviewed and are negative.       Objective:   Physical Exam  Constitutional: He appears well-developed and well-nourished. No distress.  HENT:  Head: Normocephalic and atraumatic.  Right Ear: External ear normal.  Left Ear: External ear normal.  Nose: Nose normal.  Mouth/Throat: Oropharynx is clear and moist.  Eyes: Conjunctivae and EOM are normal. Pupils are equal, round, and reactive to light. Right eye exhibits no discharge. Left eye exhibits no discharge.  Neck: Normal range of motion. Neck supple.  Cardiovascular: Normal rate and regular rhythm.   Pulmonary/Chest: Effort normal and breath sounds normal. He has no wheezes.  Abdominal: Soft. Bowel sounds are normal. He exhibits no distension and no mass. There is tenderness (minimal tenderness in the RUQ.). There is no rebound and no guarding.       Rectal with black stool, nontarry, guaiac positive.  Lymphadenopathy:    He has no cervical adenopathy.  Skin: He is not diaphoretic.          Assessment & Plan:

## 2011-05-15 NOTE — Telephone Encounter (Signed)
Left message for patient to call back  

## 2011-05-15 NOTE — Telephone Encounter (Signed)
Spoke with Shirlee Limerick.  She will make sure the POA comes with the patient.  Dr Phillips Hay wants patient seen urgently.  He will come in and see Mike Gip PA on 05/20/11

## 2011-05-20 ENCOUNTER — Encounter: Payer: Self-pay | Admitting: Physician Assistant

## 2011-05-20 ENCOUNTER — Ambulatory Visit (INDEPENDENT_AMBULATORY_CARE_PROVIDER_SITE_OTHER): Payer: Medicare PPO | Admitting: Physician Assistant

## 2011-05-20 VITALS — BP 112/56 | HR 80 | Ht 74.0 in | Wt 148.0 lb

## 2011-05-20 DIAGNOSIS — K921 Melena: Secondary | ICD-10-CM

## 2011-05-20 DIAGNOSIS — R1031 Right lower quadrant pain: Secondary | ICD-10-CM

## 2011-05-20 DIAGNOSIS — R195 Other fecal abnormalities: Secondary | ICD-10-CM

## 2011-05-20 MED ORDER — PEG-KCL-NACL-NASULF-NA ASC-C 100 G PO SOLR: 1.0000 | Freq: Once | ORAL | Status: DC

## 2011-05-20 NOTE — Patient Instructions (Signed)
You have been scheduled for a Colonoscopy. See separate instructions.  Pick up your prep kit from your pharmacy.  cc: Laurita Quint, MD

## 2011-05-20 NOTE — Progress Notes (Signed)
Reviewed and agree. ?Propofol?

## 2011-05-20 NOTE — Progress Notes (Signed)
Subjective:    Patient ID: [name] Socks Sr., male    DOB: 1923/07/29, 77 y.o.   MRN: [id number]  HPI [name] [name] is a pleasant 77 year old white male known to Dr. Ailene Ravel who was last seen in June of 2012 with complaints of black stools. He has history of dementia BPH arthritis and anxiety. At the time of that visit he had dark Hemoccult negative stool. He was to be scheduled for a colonoscopy. Is on has power of attorney and he was scheduled to followup and return for a previous visit with his son to consent for the procedure. This did not happen and to date he has not had a colonoscopy. He is referred back at this time after seeing Dr. Hetty Ely last week again with complaints of dark blackish stools. Patient is a poor historian but seems to notice dark stools intermittently and says he thinks he "has a valve leaking in his intestine". He is apparently not seen any bright red blood, he has had complaints of right lower quadrant discomfort intermittently according to his son. His appetite has been good his weight has been stable. He has not been on any iron recently nor any Pepto-Bismol and denies any aspirin or NSAID use.  Labs done 05/14/2011 showed a hemoglobin of 13.7 hematocrit of 39.7 MCV of 95 and a serum iron of 109. Rectal exam per Dr. Hetty Ely showed dark Hemoccult positive stool.    Review of Systems  Constitutional: Negative.   HENT: Negative.   Eyes: Negative.   Respiratory: Negative.   Cardiovascular: Negative.   Genitourinary: Negative.   Musculoskeletal: Negative.   Neurological: Negative.   Hematological: Negative.   Psychiatric/Behavioral: Positive for confusion.   Outpatient Prescriptions Prior to Visit  Medication Sig Dispense Refill  . acetaminophen (TYLENOL) 500 MG tablet Take 1,000 mg by mouth 2 (two) times daily.       . cetirizine (ZYRTEC) 10 MG tablet Take 10 mg by mouth daily as needed.        . Cyanocobalamin (VITAMIN B-12 PO) Take 1 capsule by mouth daily.        Marland Kitchen  LORazepam (ATIVAN) 0.5 MG tablet take 1 tablet by mouth three times a day if needed  30 tablet  0  . memantine (NAMENDA) 10 MG tablet Take 1 tablet (10 mg total) by mouth 2 (two) times daily.  60 tablet  12  . Misc Natural Products (OSTEO BI-FLEX ADV DOUBLE ST PO) Take by mouth as needed.        . Nutritional Supplements (ENSURE PO) Drinks one-two per day.       . Omega-3 Fatty Acids (FISH OIL) 1000 MG CAPS Take by mouth daily.       . sertraline (ZOLOFT) 50 MG tablet Take 50 mg by mouth daily.        . Tamsulosin HCl (FLOMAX) 0.4 MG CAPS Take 1 capsule (0.4 mg total) by mouth daily.  90 capsule  3  . tolterodine (DETROL LA) 4 MG 24 hr capsule Take 1 capsule (4 mg total) by mouth daily.  90 capsule  3  . CALCIUM PO Take 1 capsule by mouth as needed.        . etodolac (LODINE) 400 MG tablet Take one tablet by mouth two times daily after meals only if needed.  180 tablet  3  . ferrous sulfate 325 (65 FE) MG tablet Take 325 mg by mouth daily with breakfast.        Allergies  Allergen Reactions  .  Penicillins     REACTION: Rash       Objective:   Physical Exam well-developed thin elderly white male, pleasant, poor historian, accompanied by his son and granddaughter. HEENT; nontraumatic normocephalic EOMI PERRLA sclera anicteric,Neck; Supple no JVD, Cardiovascular; regular rate and rhythm with S1-S2, Pulmonary; clear bilaterally, Abdomen ;soft bowel sounds active, no palpable mass or hepatosplenomegaly, he is currently nontender to exam, Rectal; not done he was documented Hemoccult positive within the past week per Dr. Hetty Ely, Extremities; no clubbing cyanosis or edema, Psych; mood and affect appropriate.        Assessment & Plan:  #80 77 year old white male with history of dementia anxiety and BPH with intermittent complaints of dark stools over the past several months. He has a normal hemoglobin, and documented Hemoccult-positive stool. Rule out occult colon lesion.  Plan; we'll schedule  for colonoscopy with Dr. Russella Dar, procedure was discussed in detail with the patient and his family and his son who has power of attorney will sign his consent and accompany him the day of the procedure.

## 2011-05-21 ENCOUNTER — Encounter: Payer: Self-pay | Admitting: Gastroenterology

## 2011-06-04 ENCOUNTER — Telehealth: Payer: Self-pay | Admitting: Gastroenterology

## 2011-06-04 ENCOUNTER — Other Ambulatory Visit: Payer: Medicare PPO | Admitting: Gastroenterology

## 2011-06-04 ENCOUNTER — Ambulatory Visit (AMBULATORY_SURGERY_CENTER): Payer: Medicare PPO | Admitting: Gastroenterology

## 2011-06-04 ENCOUNTER — Encounter: Payer: Self-pay | Admitting: Gastroenterology

## 2011-06-04 DIAGNOSIS — D126 Benign neoplasm of colon, unspecified: Secondary | ICD-10-CM

## 2011-06-04 DIAGNOSIS — R195 Other fecal abnormalities: Secondary | ICD-10-CM

## 2011-06-04 MED ORDER — SODIUM CHLORIDE 0.9 % IV SOLN: 500.0000 mL | INTRAVENOUS | Status: DC

## 2011-06-04 NOTE — Progress Notes (Signed)
Patient did not experience any of the following events: a burn prior to discharge; a fall within the facility; wrong site/side/patient/procedure/implant event; or a hospital transfer or hospital admission upon discharge from the facility. (G8907)Patient did not experience any of the following events: a burn prior to discharge; a fall within the facility; wrong site/side/patient/procedure/implant event; or a hospital transfer or hospital admission upon discharge from the facility. (G8907) 

## 2011-06-04 NOTE — Telephone Encounter (Signed)
Spoke with pts granddaughter.  Pt took prep last night and this morning thinking his procedure was scheduled for today.  He was on our schedule for tomorrow at 2:30.  Spoke with Dr Russella Dar.  Instructed pt to come to [facility] and we will do his procedure this morning.  Pts granddaughter verbalizes understanding.

## 2011-06-04 NOTE — Op Note (Signed)
[facility] [address] [place], [place]  [address]  ENDOSCOPY PROCEDURE REPORT  PATIENT:  [name], [name]  MR#:  [id number] BIRTHDATE:  Oct 04, 1923, 87 yrs. old  GENDER:  male ENDOSCOPIST:  Judie Petit T. Russella Dar, MD, [facility] Referred by:  Laurita Quint, M.D. PROCEDURE DATE:  06/04/2011 PROCEDURE:  EGD, diagnostic 43235 ASA CLASS:  Class III INDICATIONS:  hemoccult positive stool MEDICATIONS:  These medications were titrated to patient response per physician's verbal order, There was residual sedation effect present from prior procedure, Fentanyl 12.5 mcg IV, Versed 1 mg IV TOPICAL ANESTHETIC:  none DESCRIPTION OF PROCEDURE:   After the risks benefits and alternatives of the procedure were thoroughly explained, informed consent was obtained.  The LB [id number] endoscope was introduced through the mouth and advanced to the second portion of the duodenum, without limitations.  The instrument was slowly withdrawn as the mucosa was fully examined. <<PROCEDUREIMAGES>> The esophagus and gastroesophageal junction were completely normal in appearance.  The stomach was entered and closely examined. The antrum, angularis, and lesser curvature were well visualized, including a retroflexed view of the cardia and fundus. The stomach wall was normally distensable. The scope passed easily through the pylorus into the duodenum.  The duodenal bulb was normal in appearance, as was the postbulbar duodenum.    Retroflexed views revealed a hiatal hernia, small.  The scope was then withdrawn from the patient and the procedure completed.  COMPLICATIONS:  None  ENDOSCOPIC IMPRESSION: 1) Normal EGD 2) Small hiatal hernia  RECOMMENDATIONS: 1) follow-up with PCP as planned  Ninoshka Wainwright T. Russella Dar, MD, [facility]  [name] [name] at 06/04/2011 12:42 PM  [name] [name], [phone]

## 2011-06-04 NOTE — Op Note (Signed)
[facility] [address] [place], [place]  [address]  COLONOSCOPY PROCEDURE REPORT PATIENT:  [name] [name], [name] [name]  MR#:  [id number] BIRTHDATE:  May 13, 1924, 87 yrs. old  GENDER:  male ENDOSCOPIST:  Judie Petit T. Russella Dar, MD, [facility] Referred by:  Laurita Quint, M.D. PROCEDURE DATE:  06/04/2011 PROCEDURE:  Colonoscopy with biopsy and snare polypectomy ASA CLASS:  Class III INDICATIONS:  1) heme positive stool MEDICATIONS:   These medications were titrated to patient response per physician's verbal order, Fentanyl 50 mcg IV, Versed 6 mg IV DESCRIPTION OF PROCEDURE:   After the risks benefits and alternatives of the procedure were thoroughly explained, informed consent was obtained.  Digital rectal exam was performed and revealed no abnormalities.   The [id number] endoscope was introduced through the anus and advanced to the cecum, which was identified by both the appendix and ileocecal valve, without limitations.  The quality of the prep was adequate, using MoviPrep.  The instrument was then slowly withdrawn as the colon was fully examined. <<PROCEDUREIMAGES>> FINDINGS:  There were multiple polyps identified and removed. in the rectum and sigmoid colon. There was also scarring and the polyps had features typical for pseudopolyps. 3 small polyps (3-4 mm) were biopsied using cold biopsy forceps. 4 polyps (5-6 mm) were snared without cautery. Retrieval was successful. An A.V. malformation was found in the ascending colon. It was non-bleeding. It was 4 mm in size. Otherwise normal colonoscopy without other polyps, masses, vascular ectasias, or inflammatory changes. Retroflexed views in the rectum revealed internal hemorrhoids, small.  The time to cecum =  5.5  minutes. The scope was then withdrawn (time =  13.75  min) from the patient and the procedure completed.  COMPLICATIONS:  None  ENDOSCOPIC IMPRESSION: 1) Polyps, multiple in the rectum and sigmoid colon-R/O pseudopolyps 2)  4 mm av malformation in the ascending colon 3) Internal hemorrhoids  RECOMMENDATIONS: 1) Hold aspirin, aspirin products, and anti-inflammatory medication for 2 weeks. 2) Await pathology results 3) Given your age, you will not need another colonoscopy for colon cancer screening or polyp surveillance. These types of tests usually stop around the age 18.  Venita Lick. Russella Dar, MD, [facility]  n. eSIGNED:   Venita Lick. Daylah Sayavong at 06/04/2011 12:36 PM  Loa Socks, [phone]

## 2011-06-05 ENCOUNTER — Telehealth: Payer: Self-pay | Admitting: *Deleted

## 2011-06-05 ENCOUNTER — Other Ambulatory Visit: Payer: Medicare PPO | Admitting: Gastroenterology

## 2011-06-05 NOTE — Telephone Encounter (Signed)
Left message to call me with condition of patient.  Patient is 78 yrs old, and I want to be sure that he is okay.

## 2011-06-08 ENCOUNTER — Telehealth: Payer: Self-pay | Admitting: Internal Medicine

## 2011-06-08 NOTE — Telephone Encounter (Signed)
Yes, I know his son well so I expected this

## 2011-06-08 NOTE — Telephone Encounter (Signed)
Patient states he is changing his PCP to you is this correct.  Please advise. So I can make a f/up appointment for him.

## 2011-06-09 ENCOUNTER — Encounter: Payer: Self-pay | Admitting: Gastroenterology

## 2011-06-09 NOTE — Telephone Encounter (Signed)
Okay to refill for now but he should set up appt within the next couple of months so we can meet and get to know each other Okay 3 months of all Rx

## 2011-06-09 NOTE — Telephone Encounter (Signed)
Okay to refill all meds for now as I said I have met him but never seen him as a patient It has been since June since he saw Dr Hetty Ely also He should set up an appt with me in the next couple of months

## 2011-06-09 NOTE — Telephone Encounter (Signed)
Does patient need an appointment to get his prescriptions refilled or can the prescriptions be refilled?

## 2011-06-09 NOTE — Telephone Encounter (Signed)
Spoke with son and he wanted everything filled, but Dr.Schaller had sent refills to [facility], son didn't really know what pt is taking, his daughter lives with pt and she just told him Zoloft. I also advised pt needs a f/u and per son you have met him and it should be ok? I advised he would still need appt because he's a new pt to Dr.Letvak.  Ok to refill?

## 2011-06-10 MED ORDER — SERTRALINE HCL 50 MG PO TABS: 50.0000 mg | ORAL_TABLET | Freq: Every day | ORAL | Status: DC

## 2011-06-10 NOTE — Telephone Encounter (Signed)
Spoke with Son and advised results, rx called into pharmacy

## 2011-08-20 ENCOUNTER — Other Ambulatory Visit: Payer: Self-pay | Admitting: *Deleted

## 2011-08-20 MED ORDER — SERTRALINE HCL 50 MG PO TABS: 50.0000 mg | ORAL_TABLET | Freq: Every day | ORAL | Status: DC

## 2011-08-20 MED ORDER — MEMANTINE HCL 10 MG PO TABS: 10.0000 mg | ORAL_TABLET | Freq: Two times a day (BID) | ORAL | Status: DC

## 2011-08-20 MED ORDER — LORAZEPAM 0.5 MG PO TABS: 0.5000 mg | ORAL_TABLET | Freq: Three times a day (TID) | ORAL | Status: DC

## 2011-08-20 MED ORDER — TOLTERODINE TARTRATE ER 4 MG PO CP24: 4.0000 mg | ORAL_CAPSULE | Freq: Every day | ORAL | Status: DC

## 2011-08-20 MED ORDER — TAMSULOSIN HCL 0.4 MG PO CAPS: 0.4000 mg | ORAL_CAPSULE | Freq: Every day | ORAL | Status: DC

## 2011-08-20 NOTE — Telephone Encounter (Signed)
Patient walked in asking for refills, pt has never been seen by Dr. Alphonsus Sias but Pearlie Oyster ok'd refills for 1 mth to give pt and son chance to schedule appt. Betty at the front desk will be calling son to schedule appt. rx called into pharmacy

## 2011-09-21 ENCOUNTER — Encounter: Payer: Self-pay | Admitting: Internal Medicine

## 2011-09-21 ENCOUNTER — Ambulatory Visit (INDEPENDENT_AMBULATORY_CARE_PROVIDER_SITE_OTHER): Payer: Medicare PPO | Admitting: Internal Medicine

## 2011-09-21 VITALS — BP 122/60 | HR 81 | Temp 97.4°F | Ht 71.0 in | Wt 152.0 lb

## 2011-09-21 DIAGNOSIS — R32 Unspecified urinary incontinence: Secondary | ICD-10-CM | POA: Insufficient documentation

## 2011-09-21 DIAGNOSIS — F0390 Unspecified dementia without behavioral disturbance: Secondary | ICD-10-CM

## 2011-09-21 DIAGNOSIS — F411 Generalized anxiety disorder: Secondary | ICD-10-CM

## 2011-09-21 DIAGNOSIS — N400 Enlarged prostate without lower urinary tract symptoms: Secondary | ICD-10-CM

## 2011-09-21 MED ORDER — LORAZEPAM 0.5 MG PO TABS: 0.5000 mg | ORAL_TABLET | Freq: Three times a day (TID) | ORAL | Status: DC | PRN

## 2011-09-21 NOTE — Assessment & Plan Note (Signed)
Seems to be okay on the tamsulosin

## 2011-09-21 NOTE — Assessment & Plan Note (Signed)
His biggest concern Discussed going off detrol (restart if major deterioration) Regular voiding

## 2011-09-21 NOTE — Progress Notes (Signed)
Subjective:    Patient ID: [name] Socks Sr., male    DOB: 1923-10-22, 78 y.o.   MRN: [id number]  HPI Here with DIL, Chalmers Guest concern is urinary incontinence Has urgency and will go but may have already wet himself Has been wearing pad detrol doesn't seem to be helping  No nocturia or once a night No sig frequency  No blood in stool of late Bowels are regular  Lives alone Son and DIL live next door Still drives some Does his own food shopping, DIL fixes main meal He does laundry, dishes, etc  Has had memory problems---started at least 2 years ago Forgets what he was talking about, etc Trouble remembering if he took meds---family makes up medicine box. Will miss meds ?twice a month  Takes lorazepam about once a day If not, his nerves act up  Current Outpatient Prescriptions on File Prior to Visit  Medication Sig Dispense Refill  . cetirizine (ZYRTEC) 10 MG tablet Take 10 mg by mouth daily as needed.        . Cyanocobalamin (VITAMIN B-12 PO) Take 1 capsule by mouth daily.        . memantine (NAMENDA) 10 MG tablet Take 1 tablet (10 mg total) by mouth 2 (two) times daily.  60 tablet  0  . sertraline (ZOLOFT) 50 MG tablet Take 1 tablet (50 mg total) by mouth daily.  30 tablet  0  . Tamsulosin HCl (FLOMAX) 0.4 MG CAPS Take 1 capsule (0.4 mg total) by mouth daily.  30 capsule  0  . tolterodine (DETROL LA) 4 MG 24 hr capsule Take 1 capsule (4 mg total) by mouth daily.  30 capsule  0    Allergies  Allergen Reactions  . Penicillins     REACTION: Rash    Past Medical History  Diagnosis Date  . Tremor   . OA (osteoarthritis)   . Anxiety   . Dementia     POA is Thayer Ohm  . Hearing loss   . Myocardial infarction   . Hypertrophy of prostate without urinary obstruction and other lower urinary tract symptoms (LUTS)   . Urinary incontinence     Past Surgical History  Procedure Date  . Previous ent surgery     Presumably Septoplasty  . Cholecystectomy   .  Skin biopsy     face    Family History  Problem Relation Age of Onset  . Skin cancer Mother   . Breast cancer Daughter   . Arthritis Son   . Arthritis Son     History   Social History  . Marital Status: Widowed    Spouse Name: N/A    Number of Children: 4  . Years of Education: N/A   Occupational History  . retired   .     Social History Main Topics  . Smoking status: Former Smoker -- 1.0 packs/day for 20 years    Quit date: 05/25/1960  . Smokeless tobacco: Never Used   Comment: quit over 50 years ago  . Alcohol Use: 1.0 oz/week    2 drink(s) per week     wine occassionally  . Drug Use: No  . Sexually Active: No   Other Topics Concern  . Not on file   Social History Narrative   Thinks he has living willNo health care POA--son [name] or [name] should make health care decisionsRequests DNR after discussionWouldn't want feeding tube   Review of Systems Appetite isn't great---"but I eat" Weight is down  some Sleeps okay--12 hours per day, and then stays in bed for a while    Objective:   Physical Exam  Constitutional: He appears well-developed and well-nourished. No distress.  Neck: Normal range of motion. Neck supple.  Cardiovascular: Normal rate, regular rhythm and normal heart sounds.  Exam reveals no gallop.   No murmur heard. Pulmonary/Chest: Breath sounds normal. He is in respiratory distress. He has no wheezes. He has no rales.  Abdominal: Soft. There is no tenderness.       No suprapubic dullness  Musculoskeletal: He exhibits no edema and no tenderness.  Lymphadenopathy:    He has no cervical adenopathy.  Neurological: He is alert.       Knows Monday, spring and year. (didn't know month) Knows doctor's office. [place] [id number].... President-- "Obama, ?" Did poorly with clock draw---circle, 12 and 1, then 102 in middle  Psychiatric: He has a normal mood and affect. His behavior is normal.          Assessment & Plan:

## 2011-09-21 NOTE — Patient Instructions (Signed)
Please stop the detrol LA. Try to go to the bathroom to empty bladder every 2 hours or so Stop the namenda

## 2011-09-21 NOTE — Assessment & Plan Note (Signed)
Mild and not overly progressive Will need to observe over time ?vascular (will hold off on aspirin given recent GI bleed) No reason for namenda

## 2011-09-21 NOTE — Assessment & Plan Note (Signed)
Ongoing basis Will refill the lorazepam

## 2011-10-08 ENCOUNTER — Encounter: Payer: Self-pay | Admitting: Internal Medicine

## 2011-10-08 ENCOUNTER — Ambulatory Visit (INDEPENDENT_AMBULATORY_CARE_PROVIDER_SITE_OTHER): Payer: Medicare PPO | Admitting: Internal Medicine

## 2011-10-08 VITALS — BP 160/80 | HR 91 | Temp 97.5°F | Wt 144.0 lb

## 2011-10-08 DIAGNOSIS — M25559 Pain in unspecified hip: Secondary | ICD-10-CM

## 2011-10-08 DIAGNOSIS — M25551 Pain in right hip: Secondary | ICD-10-CM

## 2011-10-08 DIAGNOSIS — G8929 Other chronic pain: Secondary | ICD-10-CM

## 2011-10-08 MED ORDER — TRAMADOL HCL 50 MG PO TABS: 25.0000 mg | ORAL_TABLET | Freq: Three times a day (TID) | ORAL | Status: DC | PRN

## 2011-10-08 NOTE — Progress Notes (Signed)
Addended by: Sueanne Margarita on: 10/08/2011 02:48 PM   Modules accepted: Orders

## 2011-10-08 NOTE — Assessment & Plan Note (Signed)
Doesn't seem to have severe hip arthritis---probably more from spine Good circulation to feet though---strong pulses  Will start acetaminophen regularly Tramadol for prn Ortho eval if persists

## 2011-10-08 NOTE — Patient Instructions (Signed)
Please start acetaminophen 650mg  three times every day---take with meals. If the pain persists, try tramadol 25-50mg  up to three times per day If the pain is not controlled, call for referral to orthopedic surgeon

## 2011-10-08 NOTE — Progress Notes (Signed)
Subjective:    Patient ID: [name] Socks Sr., male    DOB: 1923/08/26, 78 y.o.   MRN: [id number]  HPI Has had some chronic right hip pain Worse for over a week Did fall on it 9-10 years ---got hip crack but no surgery done  Had been on med in past for arthritis but hasn't used it recently (was on etodolac in past but stopped by Dr Hetty Ely in December) No history of bleeding ulcer  Eases up in bed Pain comes on as soon as he gets up Has pain with sitting also In back somewhat and feels numbness down thigh also  Current Outpatient Prescriptions on File Prior to Visit  Medication Sig Dispense Refill  . cetirizine (ZYRTEC) 10 MG tablet Take 10 mg by mouth daily as needed.        . Cyanocobalamin (VITAMIN B-12 PO) Take 1 capsule by mouth daily.        Marland Kitchen LORazepam (ATIVAN) 0.5 MG tablet Take 1 tablet (0.5 mg total) by mouth 3 (three) times daily as needed. For anxiety  90 tablet  0  . sertraline (ZOLOFT) 50 MG tablet Take 1 tablet (50 mg total) by mouth daily.  30 tablet  0  . Tamsulosin HCl (FLOMAX) 0.4 MG CAPS Take 1 capsule (0.4 mg total) by mouth daily.  30 capsule  0    Allergies  Allergen Reactions  . Penicillins     REACTION: Rash    Past Medical History  Diagnosis Date  . Tremor   . OA (osteoarthritis)   . Anxiety   . Dementia     POA is Thayer Ohm  . Hearing loss   . Myocardial infarction   . Hypertrophy of prostate without urinary obstruction and other lower urinary tract symptoms (LUTS)   . Urinary incontinence     Past Surgical History  Procedure Date  . Previous ent surgery     Presumably Septoplasty  . Cholecystectomy   . Skin biopsy     face    Family History  Problem Relation Age of Onset  . Skin cancer Mother   . Breast cancer Daughter   . Arthritis Son   . Arthritis Son     History   Social History  . Marital Status: Widowed    Spouse Name: N/A    Number of Children: 4  . Years of Education: N/A   Occupational History  .  retired   .     Social History Main Topics  . Smoking status: Former Smoker -- 1.0 packs/day for 20 years    Quit date: 05/25/1960  . Smokeless tobacco: Never Used   Comment: quit over 50 years ago  . Alcohol Use: 1.0 oz/week    2 drink(s) per week     wine occassionally  . Drug Use: No  . Sexually Active: No   Other Topics Concern  . Not on file   Social History Narrative   Thinks he has living willNo health care POA--son [name] or Xzavior should make health care decisionsRequests DNR after discussionWouldn't want feeding tube   Review of Systems No leg swelling No nausea but "doesn't feel right... Like I'm going into a dizzy spell"     Objective:   Physical Exam  Constitutional: He appears well-developed. No distress.  Musculoskeletal:       No spine tenderness but is tender in right S-I and posterior hip Fairly normal ROM in hips and doesn't really elicit pain SLR causes only posterior thigh  pain  Neurological:       Mildly antalgic gait No focal leg weakness          Assessment & Plan:

## 2011-10-22 ENCOUNTER — Encounter: Payer: Self-pay | Admitting: Family Medicine

## 2011-10-22 ENCOUNTER — Ambulatory Visit (INDEPENDENT_AMBULATORY_CARE_PROVIDER_SITE_OTHER): Payer: Medicare PPO | Admitting: Family Medicine

## 2011-10-22 VITALS — BP 110/60 | HR 80 | Temp 97.8°F | Ht 71.0 in | Wt 140.2 lb

## 2011-10-22 DIAGNOSIS — L89309 Pressure ulcer of unspecified buttock, unspecified stage: Secondary | ICD-10-CM

## 2011-10-22 DIAGNOSIS — R32 Unspecified urinary incontinence: Secondary | ICD-10-CM

## 2011-10-22 DIAGNOSIS — F0390 Unspecified dementia without behavioral disturbance: Secondary | ICD-10-CM

## 2011-10-22 DIAGNOSIS — L8992 Pressure ulcer of unspecified site, stage 2: Secondary | ICD-10-CM

## 2011-10-22 NOTE — Patient Instructions (Signed)
REFERRAL: GO THE THE FRONT ROOM AT THE ENTRANCE OF OUR CLINIC, NEAR CHECK IN. ASK FOR [name] [name]. SHE WILL HELP YOU SET UP YOUR REFERRAL. DATE: TIME:  

## 2011-10-22 NOTE — Progress Notes (Signed)
Patient Name: [name] [name]. Date of Birth: 05/22/1924 Age: 78 y.o. Medical Record Number: [id number] Gender: male Date of Encounter: 10/22/2011  History of Present Illness:  [name] Spurgeon [name]. is a 78 y.o. very pleasant male patient who presents with the following:  Elderly gentleman with some dementia who lives alone brought to the office today with his granddaughter with a worsening sore on his R buttock.  Has been wearing a pain patch for his hip and hip has been hurting for a long time. Went to the chiropractor for pain control.  Per the granddaughter, she checked on him a couple of weeks ago, he had some redness at the area, but he continued to use the OTC heat patch, now with breakdown of the skin.  3 x 4 cm decubutis  Patient Active Problem List  Diagnoses  . ANXIETY  . TREMOR, ESSENTIAL  . OSTEOARTHRITIS, MULTIPLE JOINTS  . Dementia  . Hypertrophy of prostate without urinary obstruction and other lower urinary tract symptoms (LUTS)  . Urinary incontinence  . Chronic right hip pain   Past Medical History  Diagnosis Date  . Tremor   . OA (osteoarthritis)   . Anxiety   . Dementia     POA is [name] [name]  . Hearing loss   . Myocardial infarction   . Hypertrophy of prostate without urinary obstruction and other lower urinary tract symptoms (LUTS)   . Urinary incontinence    Past Surgical History  Procedure Date  . Previous ent surgery     Presumably Septoplasty  . Cholecystectomy   . Skin biopsy     face   History  Substance Use Topics  . Smoking status: Former Smoker -- 1.0 packs/day for 20 years    Quit date: 05/25/1960  . Smokeless tobacco: Never Used   Comment: quit over 50 years ago  . Alcohol Use: 1.0 oz/week    2 drink(s) per week     wine occassionally   Family History  Problem Relation Age of Onset  . Skin cancer Mother   . Breast cancer Daughter   . Arthritis Son   . Arthritis Son    Allergies  Allergen Reactions  . Penicillins       REACTION: Rash   Current Outpatient Prescriptions on File Prior to Visit  Medication Sig Dispense Refill  . acetaminophen (TYLENOL) 650 MG CR tablet Take 650 mg by mouth 3 (three) times daily with meals.      . cetirizine (ZYRTEC) 10 MG tablet Take 10 mg by mouth daily as needed.        . Cyanocobalamin (VITAMIN B-12 PO) Take 1 capsule by mouth daily.        Marland Kitchen LORazepam (ATIVAN) 0.5 MG tablet Take 1 tablet (0.5 mg total) by mouth 3 (three) times daily as needed. For anxiety  90 tablet  0  . sertraline (ZOLOFT) 50 MG tablet Take 1 tablet (50 mg total) by mouth daily.  30 tablet  0  . Tamsulosin HCl (FLOMAX) 0.4 MG CAPS Take 1 capsule (0.4 mg total) by mouth daily.  30 capsule  0     Past Medical History, Surgical History, Social History, Family History, Problem List, Medications, and Allergies have been reviewed and updated if relevant.  Review of Systems: As above, no fever, chills, sweats, no cp or sob  Physical Examination: Filed Vitals:   10/22/11 1448  BP: 110/60  Pulse: 80  Temp: 97.8 F (36.6 C)  TempSrc: Oral  Height: 5\' 11"  (  1.803 m)  Weight: 140 lb 4 oz (63.617 kg)    Body mass index is 19.56 kg/(m^2).   GEN: WDWN, NAD, Non-toxic, Alert & Oriented x 3 HEENT: Atraumatic, Normocephalic.  Ears and Nose: No external deformity. EXTR: No clubbing/cyanosis/edema NEURO: Normal gait.  PSYCH: Normally interactive. Conversant. Not depressed or anxious appearing.  Calm demeanor.  SKIN: 3 x 4 cm stage 2 decub with surrounding skin and breakdown through the skin layer.   Assessment and Plan: 1. Decubital ulcer, stage II  Ambulatory referral to Home Health  2. Dementia    3. Incontinence of urine      Rx given for cream for decub  Home Health documentation: Face to face encounter documentation follows: Patient needs home health services for the following reasons:  Home health services needed: 1. HHRN assessment 2. Wound care management at home Patient homebound for the  following reasons: dementia, cannot drive, chronic OA, chronic hip pain, incontinent of urine. RN assessment of home for recommendations for any supplies needed and ongoing wound care assistance while decub healing.   Orders Placed This Encounter  Procedures  . Ambulatory referral to Home Health    Referral Priority:  Routine    Referral Type:  Home Health Care    Referral Reason:  Specialty Services Required    Requested Specialty:  Home Health Services    Number of Visits Requested:  1

## 2011-10-27 ENCOUNTER — Other Ambulatory Visit: Payer: Self-pay | Admitting: Internal Medicine

## 2011-10-27 ENCOUNTER — Telehealth: Payer: Self-pay

## 2011-10-27 ENCOUNTER — Other Ambulatory Visit: Payer: Self-pay | Admitting: *Deleted

## 2011-10-27 NOTE — Telephone Encounter (Signed)
Last refilled 10/08/2011, too soon?

## 2011-10-27 NOTE — Telephone Encounter (Signed)
[name] [name], pts granddaughter concerned pt is not taking meds correctly, states pt is confused. Marcelino Duster thinks need caregiver in home to make sure pt taking meds and not wandering. Marcelino Duster also wants to know when pts next appt is. Marcelino Duster said her 2 uncles who information can be released to are not forthcoming with any info about pt;even appts. I explained due to HIPPA guidelines I can not release info to her.  Marcelino Duster wants to know how she can get info; I will send message to office manager. [facility] request call back.

## 2011-10-28 ENCOUNTER — Other Ambulatory Visit: Payer: Self-pay | Admitting: *Deleted

## 2011-10-28 ENCOUNTER — Ambulatory Visit (INDEPENDENT_AMBULATORY_CARE_PROVIDER_SITE_OTHER): Payer: Medicare PPO | Admitting: Internal Medicine

## 2011-10-28 ENCOUNTER — Encounter: Payer: Self-pay | Admitting: Family Medicine

## 2011-10-28 ENCOUNTER — Encounter: Payer: Self-pay | Admitting: Internal Medicine

## 2011-10-28 ENCOUNTER — Ambulatory Visit (INDEPENDENT_AMBULATORY_CARE_PROVIDER_SITE_OTHER): Payer: Medicare PPO | Admitting: Family Medicine

## 2011-10-28 VITALS — BP 120/60 | HR 97 | Temp 98.8°F | Ht 71.0 in | Wt 140.0 lb

## 2011-10-28 VITALS — BP 120/60 | HR 97 | Temp 98.8°F | Ht 71.5 in | Wt 140.5 lb

## 2011-10-28 DIAGNOSIS — G8929 Other chronic pain: Secondary | ICD-10-CM

## 2011-10-28 DIAGNOSIS — F411 Generalized anxiety disorder: Secondary | ICD-10-CM

## 2011-10-28 DIAGNOSIS — M25559 Pain in unspecified hip: Secondary | ICD-10-CM

## 2011-10-28 DIAGNOSIS — F0390 Unspecified dementia without behavioral disturbance: Secondary | ICD-10-CM

## 2011-10-28 DIAGNOSIS — R279 Unspecified lack of coordination: Secondary | ICD-10-CM

## 2011-10-28 DIAGNOSIS — R270 Ataxia, unspecified: Secondary | ICD-10-CM

## 2011-10-28 DIAGNOSIS — N400 Enlarged prostate without lower urinary tract symptoms: Secondary | ICD-10-CM

## 2011-10-28 LAB — CBC WITH DIFFERENTIAL/PLATELET
Basophils Absolute: 0 10*3/uL (ref 0.0–0.1)
Eosinophils Absolute: 0 10*3/uL (ref 0.0–0.7)
Lymphocytes Relative: 6.9 % — ABNORMAL LOW (ref 12.0–46.0)
MCHC: 32.8 g/dL (ref 30.0–36.0)
Neutrophils Relative %: 83 % — ABNORMAL HIGH (ref 43.0–77.0)
RDW: 14.9 % — ABNORMAL HIGH (ref 11.5–14.6)

## 2011-10-28 LAB — BASIC METABOLIC PANEL
CO2: 28 mEq/L (ref 19–32)
Calcium: 8.6 mg/dL (ref 8.4–10.5)
Creatinine, Ser: 1.1 mg/dL (ref 0.4–1.5)
GFR: 66.78 mL/min (ref >60.00)
Glucose, Bld: 110 mg/dL — ABNORMAL HIGH (ref 70–99)

## 2011-10-28 LAB — HEPATIC FUNCTION PANEL: Bilirubin, Direct: 0.2 mg/dL (ref 0.0–0.3)

## 2011-10-28 LAB — VITAMIN B12: Vitamin B-12: 1231 pg/mL — ABNORMAL HIGH (ref 211–911)

## 2011-10-28 LAB — TSH: TSH: 1.92 u[IU]/mL (ref 0.35–5.50)

## 2011-10-28 MED ORDER — SERTRALINE HCL 50 MG PO TABS: 50.0000 mg | ORAL_TABLET | Freq: Every day | ORAL | Status: AC

## 2011-10-28 MED ORDER — TAMSULOSIN HCL 0.4 MG PO CAPS: 0.4000 mg | ORAL_CAPSULE | Freq: Every day | ORAL | Status: AC

## 2011-10-28 MED ORDER — TRAMADOL HCL 50 MG PO TABS: 25.0000 mg | ORAL_TABLET | Freq: Three times a day (TID) | ORAL | Status: AC | PRN

## 2011-10-28 NOTE — Assessment & Plan Note (Signed)
Needs to restart the tamsulosin

## 2011-10-28 NOTE — Assessment & Plan Note (Signed)
Asked them to restart the sertraline Lorazepam for prn only

## 2011-10-28 NOTE — Assessment & Plan Note (Signed)
Has the tramadol for this

## 2011-10-28 NOTE — Telephone Encounter (Signed)
I had spoken to son and he had worsened so increase use  Okay #90 x 0

## 2011-10-28 NOTE — Telephone Encounter (Signed)
rx sent to pharmacy by e-script  

## 2011-10-28 NOTE — Progress Notes (Signed)
Subjective:    Patient ID: [name] Socks Sr., male    DOB: 1924-01-24, 78 y.o.   MRN: [id number]  HPI In with son, granddaughter Has been weak, no energy Still with urinary incontinence Still having the hip pain  Still has ulcer from icy hot patch Nurse has been following this and it is improving Just got the endit cream  Has been confused most of the time Forgetting meds Gets in car and forgets where he is going Not eating well---better when granddaughter was there all day to prompt him Doesn't realize he is home sometimes but memory/orientation issues are sporadic  Uses the lorazepam regularly Getting it tid Apparently stopped the sertraline Family schedule isn't coordinated  Some trouble coordinating with walking Bumps into doorframes,etc  Current Outpatient Prescriptions on File Prior to Visit  Medication Sig Dispense Refill  . acetaminophen (TYLENOL) 650 MG CR tablet Take 650 mg by mouth 3 (three) times daily with meals.      . cetirizine (ZYRTEC) 10 MG tablet Take 10 mg by mouth daily as needed.        . Cyanocobalamin (VITAMIN B-12 PO) Take 1 capsule by mouth daily.        Marland Kitchen LORazepam (ATIVAN) 0.5 MG tablet Take 1 tablet (0.5 mg total) by mouth 3 (three) times daily as needed. For anxiety  90 tablet  0  . sertraline (ZOLOFT) 50 MG tablet Take 1 tablet (50 mg total) by mouth daily.  30 tablet  0  . Tamsulosin HCl (FLOMAX) 0.4 MG CAPS Take 1 capsule (0.4 mg total) by mouth daily.  30 capsule  0  . traMADol (ULTRAM) 50 MG tablet Take 0.5-1 tablets (25-50 mg total) by mouth 3 (three) times daily as needed for pain.  90 tablet  0    Allergies  Allergen Reactions  . Penicillins     REACTION: Rash    Past Medical History  Diagnosis Date  . Tremor   . OA (osteoarthritis)   . Anxiety   . Dementia     POA is Thayer Ohm  . Hearing loss   . Myocardial infarction   . Hypertrophy of prostate without urinary obstruction and other lower urinary tract symptoms  (LUTS)   . Urinary incontinence     Past Surgical History  Procedure Date  . Previous ent surgery     Presumably Septoplasty  . Cholecystectomy   . Skin biopsy     face    Family History  Problem Relation Age of Onset  . Skin cancer Mother   . Breast cancer Daughter   . Arthritis Son   . Arthritis Son     History   Social History  . Marital Status: Widowed    Spouse Name: N/A    Number of Children: 4  . Years of Education: N/A   Occupational History  . retired   .     Social History Main Topics  . Smoking status: Former Smoker -- 1.0 packs/day for 20 years    Quit date: 05/25/1960  . Smokeless tobacco: Never Used   Comment: quit over 50 years ago  . Alcohol Use: 1.0 oz/week    2 drink(s) per week     wine occassionally  . Drug Use: No  . Sexually Active: No   Other Topics Concern  . Not on file   Social History Narrative   Thinks he has living willNo health care POA--son [name] or Malone should make health care decisionsRequests DNR after discussionWouldn't want  feeding tube   Review of Systems Sleeping okay Weight is down a little over months but not since last visit    Objective:   Physical Exam  Constitutional: He appears well-developed. No distress.  Neck: Normal range of motion. Neck supple.  Cardiovascular: Normal rate, regular rhythm and normal heart sounds.  Exam reveals no gallop.   No murmur heard. Pulmonary/Chest: Effort normal and breath sounds normal. No respiratory distress. He has no wheezes. He has no rales.  Musculoskeletal: He exhibits no edema and no tenderness.  Lymphadenopathy:    He has no cervical adenopathy.  Neurological:       No focal weakness Mild ataxia---fell off to left but righted himself  Skin:       Right sacral ulcer is granulating well without inflammation  Psychiatric: He has a normal mood and affect. His behavior is normal.          Assessment & Plan:

## 2011-10-28 NOTE — Assessment & Plan Note (Signed)
Probably multiinfarct but not clear Worse now--due to regular lorazepam or tramadol, new stroke, worsened functional status  Shouldn't drive Needs care during day At this point, probably needs supervision on ongoing basis (24 hour?)

## 2011-10-28 NOTE — Assessment & Plan Note (Signed)
Mild But has dementia and urinary incontinence Will check MRI

## 2011-10-28 NOTE — Patient Instructions (Signed)
Please restart tamsulosin and sertraline Use the lorazepam only as needed if anxiety acts up You should not drive for now---we will reevaluate this over time You will need to make arrangements for regular supervision during the day

## 2011-10-29 MED ORDER — LORAZEPAM 0.5 MG PO TABS: 0.5000 mg | ORAL_TABLET | Freq: Three times a day (TID) | ORAL | Status: DC | PRN

## 2011-10-29 NOTE — Telephone Encounter (Signed)
Please check to see if they have had any luck finding him

## 2011-10-29 NOTE — Telephone Encounter (Signed)
Okay #[phone] 

## 2011-10-29 NOTE — Telephone Encounter (Signed)
Called [name] [name] and explained that she would need to be designated by patient or POA as a designated party to share information with and/or we would need a completed HIPPA form to release information.  I also talked to her in general about places to check with to locate him and asked if she had contacted the authorities.  She said that they were doing all these things.  I offered to help in any way we can and reassured her and comforted her about her concerns with telling her that he was lucky to have family members concerned and involved in his care.  I also asked that someone call us back when they locate the patient to update [place] because we were concerned as well and that we would continue to stay aware of contacting family members if he makes contact with [place] until we have heard back that he has been located.
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
## 2011-10-29 NOTE — Telephone Encounter (Signed)
rx called into pharmacy

## 2011-10-30 ENCOUNTER — Telehealth: Payer: Self-pay

## 2011-10-30 NOTE — Progress Notes (Signed)
Seen by Dr Letvak 

## 2011-10-30 NOTE — Telephone Encounter (Signed)
See phone note 10/29/11
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
## 2011-10-30 NOTE — Telephone Encounter (Signed)
Rescheduled the MRI for 11/05/2011 at [facility] son has been notified.
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
## 2011-10-30 NOTE — Telephone Encounter (Signed)
Good idea about the battery  Please work on rescheduling MRI

## 2011-11-02 ENCOUNTER — Telehealth: Payer: Self-pay | Admitting: Internal Medicine

## 2011-11-02 DIAGNOSIS — IMO0002 Reserved for concepts with insufficient information to code with codable children: Secondary | ICD-10-CM

## 2011-11-02 DIAGNOSIS — M1990 Unspecified osteoarthritis, unspecified site: Secondary | ICD-10-CM

## 2011-11-02 DIAGNOSIS — G252 Other specified forms of tremor: Secondary | ICD-10-CM

## 2011-11-02 DIAGNOSIS — M25559 Pain in unspecified hip: Secondary | ICD-10-CM

## 2011-11-02 DIAGNOSIS — G250 Essential tremor: Secondary | ICD-10-CM

## 2011-11-02 NOTE — Telephone Encounter (Signed)
Triage Record Num: [id number] Operator: Chevis Pretty Patient Name: [name] Socks Sr. Call Date & Time: 10/31/2011 3:47:42PM Patient Phone: [phone] PCP: Tillman Abide Patient Gender: Male PCP Fax : [phone] Patient DOB: 12-25-1923 Practice Name: [facility] Reason for Call: Caller: Scott/Other; PCP: Tillman Abide I.; CB#: [phone]; Call regarding Weakness, temp fluctuation; onset of generalized weakness over past 5 days. Has required home care around the clock. Appetite off. Temp to 101 O. Seen in office 10/28/11; did get confused and got lost while driving the care 44/01/02. Does not have home care or hospice care "set up yet." Did c/o urinary issues, and started flomax. Per protocol, TC to Dr. Milinda Antis; needs to be evaluated. Advised UC or ED; family prefers [facility] ED. Protocol(s) Used: Fever - Adult Recommended Outcome per Protocol: Call Provider Immediately Reason for Outcome: Frail elderly, immunocompromised or pregnant person with temperature of 100.5 F (38.0 C) or higher Care Advice: ~ Chills start as temperature rises. Take temperature when chills have stopped. It takes 20-60 minutes for fever reducing meds to work. Take your temperature 1-2 hours after taking one of these medications to check if they are working. ~ ~ HEALTH PROMOTION / MAINTENANCE ~ SYMPTOM / CONDITION MANAGEMENT ~ CAUTIONS Most adults need to drink 6-10 eight-ounce glasses (1.2-2.0 liters) of fluids per day unless previously told to limit fluid intake for other medical reasons. Limit fluids that contain caffeine, sugar or alcohol. Urine will be a very light yellow color when you drink enough fluids. ~ COMFORT MEASURES FOR A FEVER: - Drink cool liquids or eat ice chips or popsicles. Avoid drinks with alcohol or caffeine. - Wear one layer of light-weight clothing. - Consider using a fan to improve circulation. - Rest until temperature returns to normal and other symptoms  improve. - Use a lightweight blanket or other bedding. - A lukewarm (not cold) bath or shower can help lower body temperature. Cold water can cause shivering and raise temperature. If shivering starts, dry off and cover with lightweight clothing. ~ Analgesic/Antipyretic Advice - Acetaminophen: Consider acetaminophen as directed on label or by pharmacist/provider for pain or fever PRECAUTIONS: - Use if there is no history of liver disease, alcoholism, or intake of three or more alcohol drinks per day - Only if approved by provider during pregnancy or when breastfeeding - During pregnancy, acetaminophen should not be taken more than 3 consecutive days without telling provider - Do not exceed recommended dose or frequency ~ See another provider immediately if unable to talk with your provider within 1 hour. Consider the closest urgent care or ED if an office or clinic is not available. Another adult should drive. ~ Systemic Inflammatory Response Syndrome (SIRS): Watch for signs of a generalized, whole body infection. Occurs within days of a localized infection, especially of the urinary, GI, respiratory or nervous systems; or after a traumatic injury or invasive procedure. - Call EMS 911 if symptoms have worsened, such as increasing confusion or unusual drowsiness; cold and clammy ~ 10/31/2011 4:02:51PM Page 1 of 2 CAN_TriageRpt_V2 [facility]-A-Nurse Triage Call Report Patient Name: [name] Ciancio Sr. continuation page/s skin; no urine output; rapid respiration (>30/min.) or slow respiration (<10/min.); struggling to breathe. - Go to the ED immediately for early symptoms of rapid pulse >90/min. or rapid breathing >20/min. at rest; chills; oral temperature >100.4 F (38 C) or <96.8 F (36 C) when associated with conditions noted. Analgesic/Antipyretic Advice - NSAIDs: Consider aspirin, ibuprofen, naproxen or ketoprofen for pain or fever as  directed on label or by pharmacist/provider. PRECAUTIONS: -  If over 44 years of age, should not take longer than 1 week without consulting provider. EXCEPTIONS: - Should not be used if taking blood thinners or have bleeding problems. - Do not use if have history of sensitivity/allergy to any of these medications; or history of cardiovascular, ulcer, kidney, liver disease or diabetes unless approved by provider. - Do not exceed recommended dose or frequency. ~ 10/31/2011 4:02:51PM Page 2 of 2 CAN_TriageRpt_V2

## 2011-11-02 NOTE — Telephone Encounter (Signed)
Please check on his status

## 2011-11-03 NOTE — Telephone Encounter (Signed)
Left message for son to return my call

## 2011-11-05 ENCOUNTER — Ambulatory Visit: Payer: Self-pay | Admitting: Internal Medicine

## 2011-11-11 ENCOUNTER — Ambulatory Visit: Payer: Medicare PPO | Admitting: Internal Medicine

## 2011-12-03 ENCOUNTER — Other Ambulatory Visit: Payer: Self-pay | Admitting: *Deleted

## 2011-12-03 NOTE — Telephone Encounter (Signed)
Yes patient has switched physicians, no longer a patient here, being seen at [facility]

## 2011-12-03 NOTE — Telephone Encounter (Signed)
Please check with him I believe he has switched doctors

## 2012-01-21 ENCOUNTER — Ambulatory Visit: Payer: Medicare PPO | Admitting: Internal Medicine

## 2012-04-04 ENCOUNTER — Other Ambulatory Visit: Payer: Self-pay | Admitting: *Deleted

## 2012-04-04 NOTE — Telephone Encounter (Signed)
Ok to refill? Patient doesn't have any future appts, they have been canceled. Please advise

## 2012-04-05 NOTE — Telephone Encounter (Signed)
He switched to another doctor Let the pharmacy know

## 2012-04-05 NOTE — Telephone Encounter (Signed)
Sent message back to [facility]

## 2012-05-30 ENCOUNTER — Ambulatory Visit: Payer: Self-pay | Admitting: Internal Medicine

## 2012-07-05 ENCOUNTER — Other Ambulatory Visit: Payer: Self-pay | Admitting: *Deleted

## 2012-07-05 NOTE — Telephone Encounter (Signed)
Called pharmacist to see when was the last time pt had rx filled and per the faxed request the original rx was written 06/16/2011 with 7 refills written by Dr. Hetty Ely, last refill per pharmacist was 07/04/2012. I advised pharmacy that Dr.Schaller is retired and couldn't have written that rx. Pharmacist then came back and states that Dr. Alphonsus Sias wrote rx on 04/26/2012, I advised again we don't have record of that. Per pharmacist he would need to pull the hard copy and call us back.  I called son and asked was he still our patient and son didn't know because his sister in-law was taking him to the dr. I advised that he hasn't been coming her and he was going to check and call back.

## 2012-07-06 NOTE — Telephone Encounter (Signed)
Denied meds sent back to pharmacy

## 2012-07-06 NOTE — Telephone Encounter (Signed)
I know that he has transferred care They have to ask for refills from current doctor

## 2013-04-17 ENCOUNTER — Ambulatory Visit: Payer: Self-pay | Admitting: Internal Medicine

## 2013-12-05 ENCOUNTER — Emergency Department: Payer: Self-pay | Admitting: Emergency Medicine

## 2013-12-05 LAB — URINALYSIS, COMPLETE
Bacteria: NONE SEEN
Bilirubin,UR: NEGATIVE
Blood: NEGATIVE
GLUCOSE, UR: NEGATIVE mg/dL (ref 0–75)
LEUKOCYTE ESTERASE: NEGATIVE
Nitrite: NEGATIVE
PH: 5 (ref 4.5–8.0)
Protein: NEGATIVE
RBC,UR: <1 /HPF (ref 0–5)
SPECIFIC GRAVITY: 1.019 (ref 1.003–1.030)
SQUAMOUS EPITHELIAL: NONE SEEN

## 2013-12-05 LAB — COMPREHENSIVE METABOLIC PANEL
ALK PHOS: 70 U/L
ALT: 9 U/L — AB (ref 12–78)
ANION GAP: 4 — AB (ref 7–16)
Albumin: 3.9 g/dL (ref 3.4–5.0)
BILIRUBIN TOTAL: 0.5 mg/dL (ref 0.2–1.0)
BUN: 24 mg/dL — ABNORMAL HIGH (ref 7–18)
CHLORIDE: 102 mmol/L (ref 98–107)
CO2: 33 mmol/L — AB (ref 21–32)
Calcium, Total: 8.7 mg/dL (ref 8.5–10.1)
Creatinine: 1.37 mg/dL — ABNORMAL HIGH (ref 0.60–1.30)
EGFR (African American): 53 — ABNORMAL LOW
GFR CALC NON AF AMER: 46 — AB
GLUCOSE: 120 mg/dL — AB (ref 65–99)
Osmolality: 283 (ref 275–301)
Potassium: 3.9 mmol/L (ref 3.5–5.1)
SGOT(AST): 18 U/L (ref 15–37)
Sodium: 139 mmol/L (ref 136–145)
TOTAL PROTEIN: 6.5 g/dL (ref 6.4–8.2)

## 2013-12-05 LAB — CBC
HCT: 36 % — AB (ref 40.0–52.0)
HGB: 12 g/dL — AB (ref 13.0–18.0)
MCH: 31.6 pg (ref 26.0–34.0)
MCHC: 33.3 g/dL (ref 32.0–36.0)
MCV: 95 fL (ref 80–100)
PLATELETS: 108 10*3/uL — AB (ref 150–440)
RBC: 3.79 10*6/uL — AB (ref 4.40–5.90)
RDW: 13 % (ref 11.5–14.5)
WBC: 4.3 10*3/uL (ref 3.8–10.6)

## 2013-12-05 LAB — TROPONIN I: Troponin-I: <0.02 ng/mL

## 2014-03-28 ENCOUNTER — Ambulatory Visit (HOSPITAL_COMMUNITY): Admit: 2014-03-28 | Payer: Self-pay | Admitting: Cardiology

## 2014-03-28 ENCOUNTER — Encounter (HOSPITAL_COMMUNITY)
Admission: RE | Disposition: A | Discharge status: Skilled Nursing Facility | Payer: Medicare PPO | Source: Other Acute Inpatient Hospital | Attending: Cardiology

## 2014-03-28 ENCOUNTER — Encounter (HOSPITAL_COMMUNITY): Payer: Self-pay | Admitting: Emergency Medicine

## 2014-03-28 ENCOUNTER — Ambulatory Visit (HOSPITAL_COMMUNITY): Payer: Medicare PPO

## 2014-03-28 ENCOUNTER — Inpatient Hospital Stay (HOSPITAL_COMMUNITY)
Admission: RE | Admit: 2014-03-28 | Discharge: 2014-04-04 | DRG: 246 | Disposition: A | Discharge status: Skilled Nursing Facility | Payer: Medicare PPO | Source: Other Acute Inpatient Hospital | Attending: Cardiology | Admitting: Cardiology

## 2014-03-28 DIAGNOSIS — D649 Anemia, unspecified: Secondary | ICD-10-CM | POA: Diagnosis not present

## 2014-03-28 DIAGNOSIS — I2510 Atherosclerotic heart disease of native coronary artery without angina pectoris: Secondary | ICD-10-CM | POA: Diagnosis present

## 2014-03-28 DIAGNOSIS — N178 Other acute kidney failure: Secondary | ICD-10-CM | POA: Diagnosis not present

## 2014-03-28 DIAGNOSIS — D61818 Other pancytopenia: Secondary | ICD-10-CM | POA: Diagnosis present

## 2014-03-28 DIAGNOSIS — B961 Klebsiella pneumoniae [K. pneumoniae] as the cause of diseases classified elsewhere: Secondary | ICD-10-CM | POA: Diagnosis present

## 2014-03-28 DIAGNOSIS — N141 Nephropathy induced by other drugs, medicaments and biological substances: Secondary | ICD-10-CM | POA: Diagnosis not present

## 2014-03-28 DIAGNOSIS — I2582 Chronic total occlusion of coronary artery: Secondary | ICD-10-CM | POA: Diagnosis present

## 2014-03-28 DIAGNOSIS — E119 Type 2 diabetes mellitus without complications: Secondary | ICD-10-CM | POA: Diagnosis not present

## 2014-03-28 DIAGNOSIS — R079 Chest pain, unspecified: Secondary | ICD-10-CM | POA: Diagnosis present

## 2014-03-28 DIAGNOSIS — T508X5A Adverse effect of diagnostic agents, initial encounter: Secondary | ICD-10-CM | POA: Diagnosis not present

## 2014-03-28 DIAGNOSIS — F0390 Unspecified dementia without behavioral disturbance: Secondary | ICD-10-CM | POA: Diagnosis present

## 2014-03-28 DIAGNOSIS — E1169 Type 2 diabetes mellitus with other specified complication: Secondary | ICD-10-CM | POA: Diagnosis present

## 2014-03-28 DIAGNOSIS — Z87891 Personal history of nicotine dependence: Secondary | ICD-10-CM | POA: Diagnosis not present

## 2014-03-28 DIAGNOSIS — A4159 Other Gram-negative sepsis: Secondary | ICD-10-CM | POA: Insufficient documentation

## 2014-03-28 DIAGNOSIS — I252 Old myocardial infarction: Secondary | ICD-10-CM | POA: Diagnosis not present

## 2014-03-28 DIAGNOSIS — M1990 Unspecified osteoarthritis, unspecified site: Secondary | ICD-10-CM | POA: Diagnosis present

## 2014-03-28 DIAGNOSIS — A414 Sepsis due to anaerobes: Secondary | ICD-10-CM | POA: Insufficient documentation

## 2014-03-28 DIAGNOSIS — I2109 ST elevation (STEMI) myocardial infarction involving other coronary artery of anterior wall: Secondary | ICD-10-CM | POA: Diagnosis present

## 2014-03-28 DIAGNOSIS — Z66 Do not resuscitate: Secondary | ICD-10-CM | POA: Diagnosis not present

## 2014-03-28 DIAGNOSIS — B9620 Unspecified Escherichia coli [E. coli] as the cause of diseases classified elsewhere: Secondary | ICD-10-CM | POA: Insufficient documentation

## 2014-03-28 DIAGNOSIS — J40 Bronchitis, not specified as acute or chronic: Secondary | ICD-10-CM | POA: Diagnosis present

## 2014-03-28 DIAGNOSIS — G20 Parkinson's disease: Secondary | ICD-10-CM | POA: Diagnosis present

## 2014-03-28 DIAGNOSIS — J189 Pneumonia, unspecified organism: Secondary | ICD-10-CM

## 2014-03-28 DIAGNOSIS — R7881 Bacteremia: Secondary | ICD-10-CM

## 2014-03-28 DIAGNOSIS — N400 Enlarged prostate without lower urinary tract symptoms: Secondary | ICD-10-CM | POA: Diagnosis present

## 2014-03-28 DIAGNOSIS — Z880 Allergy status to penicillin: Secondary | ICD-10-CM | POA: Diagnosis not present

## 2014-03-28 DIAGNOSIS — K8020 Calculus of gallbladder without cholecystitis without obstruction: Secondary | ICD-10-CM | POA: Insufficient documentation

## 2014-03-28 DIAGNOSIS — I6522 Occlusion and stenosis of left carotid artery: Secondary | ICD-10-CM | POA: Diagnosis not present

## 2014-03-28 DIAGNOSIS — R6521 Severe sepsis with septic shock: Secondary | ICD-10-CM | POA: Diagnosis present

## 2014-03-28 DIAGNOSIS — R509 Fever, unspecified: Secondary | ICD-10-CM

## 2014-03-28 DIAGNOSIS — L740 Miliaria rubra: Secondary | ICD-10-CM | POA: Insufficient documentation

## 2014-03-28 DIAGNOSIS — I249 Acute ischemic heart disease, unspecified: Secondary | ICD-10-CM | POA: Diagnosis present

## 2014-03-28 DIAGNOSIS — R000 Tachycardia, unspecified: Secondary | ICD-10-CM | POA: Diagnosis present

## 2014-03-28 DIAGNOSIS — H9190 Unspecified hearing loss, unspecified ear: Secondary | ICD-10-CM | POA: Diagnosis present

## 2014-03-28 DIAGNOSIS — A499 Bacterial infection, unspecified: Secondary | ICD-10-CM | POA: Insufficient documentation

## 2014-03-28 DIAGNOSIS — N189 Chronic kidney disease, unspecified: Secondary | ICD-10-CM | POA: Diagnosis not present

## 2014-03-28 DIAGNOSIS — L270 Generalized skin eruption due to drugs and medicaments taken internally: Secondary | ICD-10-CM | POA: Insufficient documentation

## 2014-03-28 HISTORY — PX: LEFT HEART CATHETERIZATION WITH CORONARY ANGIOGRAM: SHX5451

## 2014-03-28 LAB — I-STAT TROPONIN, ED: Troponin i, poc: 0.1 ng/mL (ref 0.00–0.08)

## 2014-03-28 LAB — PROTIME-INR
INR: 1.1 (ref 0.00–1.49)
PROTHROMBIN TIME: 14.3 s (ref 11.6–15.2)

## 2014-03-28 LAB — BASIC METABOLIC PANEL
Anion gap: 13 (ref 5–15)
BUN: 26 mg/dL — AB (ref 6–23)
CALCIUM: 8.9 mg/dL (ref 8.4–10.5)
CO2: 26 mEq/L (ref 19–32)
Chloride: 100 mEq/L (ref 96–112)
Creatinine, Ser: 1.21 mg/dL (ref 0.50–1.35)
GFR calc Af Amer: 60 mL/min — ABNORMAL LOW (ref >90)
GFR, EST NON AFRICAN AMERICAN: 52 mL/min — AB (ref >90)
Glucose, Bld: 200 mg/dL — ABNORMAL HIGH (ref 70–99)
Potassium: 4.1 mEq/L (ref 3.7–5.3)
Sodium: 139 mEq/L (ref 137–147)

## 2014-03-28 LAB — CBC
HCT: 36.9 % — ABNORMAL LOW (ref 39.0–52.0)
Hemoglobin: 12 g/dL — ABNORMAL LOW (ref 13.0–17.0)
MCH: 30.9 pg (ref 26.0–34.0)
MCHC: 32.5 g/dL (ref 30.0–36.0)
MCV: 95.1 fL (ref 78.0–100.0)
PLATELETS: 74 10*3/uL — AB (ref 150–400)
RBC: 3.88 MIL/uL — AB (ref 4.22–5.81)
RDW: 13.3 % (ref 11.5–15.5)
WBC: 2.3 10*3/uL — AB (ref 4.0–10.5)

## 2014-03-28 LAB — I-STAT CG4 LACTIC ACID, ED: Lactic Acid, Venous: 2.45 mmol/L — ABNORMAL HIGH (ref 0.5–2.2)

## 2014-03-28 SURGERY — LEFT HEART CATHETERIZATION WITH CORONARY ANGIOGRAM
Anesthesia: LOCAL

## 2014-03-28 MED ORDER — HEPARIN SODIUM (PORCINE) 1000 UNIT/ML IJ SOLN
4000.0000 [IU] | Freq: Once | INTRAMUSCULAR | Status: DC
  Filled 2014-03-28: qty 4

## 2014-03-28 MED ORDER — ONDANSETRON HCL 4 MG/2ML IJ SOLN: 4.0000 mg | Freq: Four times a day (QID) | INTRAMUSCULAR | Status: DC | PRN

## 2014-03-28 MED ORDER — PIPERACILLIN-TAZOBACTAM 3.375 G IVPB
3.3750 g | Freq: Three times a day (TID) | INTRAVENOUS | Status: DC
  Administered 2014-03-29 – 2014-04-02 (×14): 3.375 g via INTRAVENOUS
  Filled 2014-03-28 (×18): qty 50

## 2014-03-28 MED ORDER — TAMSULOSIN HCL 0.4 MG PO CAPS
0.4000 mg | ORAL_CAPSULE | Freq: Every day | ORAL | Status: DC
  Administered 2014-03-29 – 2014-04-04 (×7): 0.4 mg via ORAL
  Filled 2014-03-28 (×7): qty 1

## 2014-03-28 MED ORDER — HEPARIN (PORCINE) IN NACL 2-0.9 UNIT/ML-% IJ SOLN
INTRAMUSCULAR | Status: AC
  Filled 2014-03-28: qty 1000

## 2014-03-28 MED ORDER — LORAZEPAM 0.5 MG PO TABS
0.5000 mg | ORAL_TABLET | Freq: Three times a day (TID) | ORAL | Status: DC | PRN
  Administered 2014-03-29 – 2014-04-03 (×4): 0.5 mg via ORAL
  Filled 2014-03-28 (×4): qty 1

## 2014-03-28 MED ORDER — ACETAMINOPHEN 325 MG PO TABS
650.0000 mg | ORAL_TABLET | ORAL | Status: DC | PRN
  Administered 2014-03-31: 650 mg via ORAL
  Filled 2014-03-28: qty 2

## 2014-03-28 MED ORDER — METOPROLOL TARTRATE 1 MG/ML IV SOLN
2.5000 mg | Freq: Once | INTRAVENOUS | Status: AC
  Administered 2014-03-28: 2.5 mg via INTRAVENOUS
  Filled 2014-03-28: qty 5

## 2014-03-28 MED ORDER — ASPIRIN 81 MG PO CHEW
81.0000 mg | CHEWABLE_TABLET | Freq: Every day | ORAL | Status: DC
  Administered 2014-03-29 – 2014-04-04 (×7): 81 mg via ORAL
  Filled 2014-03-28 (×7): qty 1

## 2014-03-28 MED ORDER — METOPROLOL TARTRATE 1 MG/ML IV SOLN
INTRAVENOUS | Status: AC
  Filled 2014-03-28: qty 5

## 2014-03-28 MED ORDER — ATORVASTATIN CALCIUM 40 MG PO TABS
40.0000 mg | ORAL_TABLET | Freq: Every day | ORAL | Status: DC
  Administered 2014-03-29 – 2014-04-03 (×6): 40 mg via ORAL
  Filled 2014-03-28 (×7): qty 1

## 2014-03-28 MED ORDER — VANCOMYCIN HCL IN DEXTROSE 1-5 GM/200ML-% IV SOLN
1000.0000 mg | INTRAVENOUS | Status: DC
  Administered 2014-03-28 – 2014-03-29 (×2): 1000 mg via INTRAVENOUS
  Filled 2014-03-28 (×3): qty 200

## 2014-03-28 MED ORDER — METOPROLOL TARTRATE 12.5 MG HALF TABLET
12.5000 mg | ORAL_TABLET | Freq: Two times a day (BID) | ORAL | Status: DC
  Administered 2014-03-29 – 2014-04-04 (×12): 12.5 mg via ORAL
  Filled 2014-03-28 (×15): qty 1

## 2014-03-28 MED ORDER — TICAGRELOR 90 MG PO TABS: 90.0000 mg | ORAL_TABLET | Freq: Two times a day (BID) | ORAL | Status: DC

## 2014-03-28 MED ORDER — NITROGLYCERIN IN D5W 200-5 MCG/ML-% IV SOLN: 5.0000 ug/min | INTRAVENOUS | Status: DC

## 2014-03-28 MED ORDER — SERTRALINE HCL 50 MG PO TABS
50.0000 mg | ORAL_TABLET | Freq: Every day | ORAL | Status: DC
  Administered 2014-03-29 – 2014-04-04 (×7): 50 mg via ORAL
  Filled 2014-03-28 (×7): qty 1

## 2014-03-28 MED ORDER — LIDOCAINE HCL (PF) 1 % IJ SOLN
INTRAMUSCULAR | Status: AC
  Filled 2014-03-28: qty 30

## 2014-03-28 MED ORDER — TICAGRELOR 90 MG PO TABS
90.0000 mg | ORAL_TABLET | Freq: Two times a day (BID) | ORAL | Status: DC
  Administered 2014-03-29 – 2014-04-04 (×14): 90 mg via ORAL
  Filled 2014-03-28 (×16): qty 1

## 2014-03-28 MED ORDER — NITROGLYCERIN 1 MG/10 ML FOR IR/CATH LAB
INTRA_ARTERIAL | Status: AC
  Filled 2014-03-28: qty 10

## 2014-03-28 MED ORDER — ASPIRIN 81 MG PO CHEW: 324.0000 mg | CHEWABLE_TABLET | ORAL | Status: DC

## 2014-03-28 MED ORDER — INFLUENZA VAC SPLIT QUAD 0.5 ML IM SUSY
0.5000 mL | PREFILLED_SYRINGE | INTRAMUSCULAR | Status: AC
  Administered 2014-03-29: 0.5 mL via INTRAMUSCULAR
  Filled 2014-03-28: qty 0.5

## 2014-03-28 MED ORDER — ASPIRIN 300 MG RE SUPP: 300.0000 mg | RECTAL | Status: DC

## 2014-03-28 MED ORDER — FENTANYL CITRATE 0.05 MG/ML IJ SOLN
INTRAMUSCULAR | Status: AC
  Filled 2014-03-28: qty 2

## 2014-03-28 MED ORDER — INSULIN ASPART 100 UNIT/ML ~~LOC~~ SOLN
0.0000 [IU] | Freq: Three times a day (TID) | SUBCUTANEOUS | Status: DC
  Administered 2014-03-29 – 2014-03-31 (×2): 2 [IU] via SUBCUTANEOUS
  Administered 2014-04-01: 1 [IU] via SUBCUTANEOUS

## 2014-03-28 MED ORDER — SODIUM CHLORIDE 0.9 % IV SOLN
INTRAVENOUS | Status: DC
  Administered 2014-03-28 – 2014-04-02 (×2): via INTRAVENOUS

## 2014-03-28 MED ORDER — ACETAMINOPHEN 650 MG RE SUPP
650.0000 mg | RECTAL | Status: DC | PRN
  Administered 2014-03-28: 650 mg via RECTAL
  Filled 2014-03-28: qty 1

## 2014-03-28 MED ORDER — BIVALIRUDIN 250 MG IV SOLR
INTRAVENOUS | Status: AC
  Filled 2014-03-28: qty 250

## 2014-03-28 MED ORDER — TICAGRELOR 90 MG PO TABS
ORAL_TABLET | ORAL | Status: AC
  Filled 2014-03-28: qty 2

## 2014-03-28 MED ORDER — ASPIRIN EC 81 MG PO TBEC: 81.0000 mg | DELAYED_RELEASE_TABLET | Freq: Every day | ORAL | Status: DC

## 2014-03-28 MED ORDER — HEPARIN SODIUM (PORCINE) 5000 UNIT/ML IJ SOLN
4000.0000 [IU] | Freq: Once | INTRAMUSCULAR | Status: AC
  Administered 2014-03-28: 4000 [IU] via SUBCUTANEOUS
  Filled 2014-03-28 (×2): qty 1

## 2014-03-28 MED ORDER — ACETAMINOPHEN 325 MG PO TABS: 650.0000 mg | ORAL_TABLET | ORAL | Status: DC | PRN

## 2014-03-28 MED ORDER — NITROGLYCERIN 0.4 MG SL SUBL: 0.4000 mg | SUBLINGUAL_TABLET | SUBLINGUAL | Status: DC | PRN

## 2014-03-28 NOTE — Progress Notes (Signed)
ANTIBIOTIC CONSULT NOTE - INITIAL  Pharmacy Consult for vancomycin and Zosyn Indication: sepsis  Allergies  Allergen Reactions  . Penicillins     REACTION: Rash    Patient Measurements: Height: 5\' 11"  (180.3 cm) Weight: 148 lb 5.9 oz (67.3 kg) IBW/kg (Calculated) : 75.3   Vital Signs: Temp: 103.8 F (39.9 C) (11/04 1921) Temp Source: Rectal (11/04 1921) BP: 127/55 mmHg (11/04 2010) Pulse Rate: 105 (11/04 2010) Intake/Output from previous day:   Intake/Output from this shift:    Labs:  Recent Labs  03/28/14 1923  WBC 2.3*  HGB 12.0*  PLT 74*  CREATININE 1.21   Estimated Creatinine Clearance: 40.2 mL/min (by C-G formula based on Cr of 1.21). No results for input(s): VANCOTROUGH, VANCOPEAK, VANCORANDOM, GENTTROUGH, GENTPEAK, GENTRANDOM, TOBRATROUGH, TOBRAPEAK, TOBRARND, AMIKACINPEAK, AMIKACINTROU, AMIKACIN in the last 72 hours.   Microbiology: No results found for this or any previous visit (from the past 720 hour(s)).  Medical History: Past Medical History  Diagnosis Date  . Tremor   . OA (osteoarthritis)   . Anxiety   . Dementia     POA is [name]  . Hearing loss   . Myocardial infarction   . Hypertrophy of prostate without urinary obstruction and other lower urinary tract symptoms (LUTS)   . Urinary incontinence     Assessment: 21 YOM with dementia brought in as code stemi and taken to cath lab. Had an episode of vomiting this afternoon at home and measured temperature of 99.  WBC 2.3, tmax in ED 103.8. SCr 1.21, est CrCl ~44mL/min.  Goal of Therapy:  Vancomycin trough level 15-20 mcg/ml  Plan:  1. Zosyn 3.375g IV q8h EI 2. Vancomycin 1000mg  IV q24h  3. Follow up c/s, clinical progression, renal function, trough at [facility]. Brannon Decaire, PharmD, BCPS Clinical Pharmacist Pager: [phone] 03/28/2014 10:40 PM

## 2014-03-28 NOTE — ED Provider Notes (Signed)
CSN: [id number]     Arrival date & time 03/28/14  1913 History   First MD Initiated Contact with Patient 03/28/14 1921     Chief Complaint  Patient presents with  . Tachycardia  . Fever     (Consider location/radiation/quality/duration/timing/severity/associated sxs/prior Treatment) HPI  Patient complains of central chest pain to his caregivers this afternoon around 2 PM. Subsequent to that he had an episode of vomiting. The patient has dementia and has difficulty expressing symptoms. His son and his son's wife who care for the patient were not aware of any problems until this afternoon. He did feel hot to the touch and there measured temperature at home was 99. They had not noticed him to have any significant amount of coughing. They have noticed some morning cough which didn't seem particularly severe. The patient himself is endorsing some epigastric discomfort and reports he's sore in there. He doesn't have much else to add to the history. Medics had an abnormal EKG with suspicion for STEMI and the patient arrived as a STEMI alert.  Past Medical History  Diagnosis Date  . Tremor   . OA (osteoarthritis)   . Anxiety   . Dementia     POA is [name] [name]  . Hearing loss   . Myocardial infarction   . Hypertrophy of prostate without urinary obstruction and other lower urinary tract symptoms (LUTS)   . Urinary incontinence    Past Surgical History  Procedure Laterality Date  . Previous ent surgery      Presumably Septoplasty  . Cholecystectomy    . Skin biopsy      face   Family History  Problem Relation Age of Onset  . Skin cancer Mother   . Breast cancer Daughter   . Arthritis Son   . Arthritis Son    History  Substance Use Topics  . Smoking status: Former Smoker -- 1.00 packs/day for 20 years    Quit date: 05/25/1960  . Smokeless tobacco: Never Used     Comment: quit over 50 years ago  . Alcohol Use: No     Comment: wine occassionally    Review of Systems 10  Systems reviewed and are negative for acute change except as noted in the HPI. The review of systems comes from the family members who live with the patient and observe him on a daily basis. The patient himself cannot provide adequate review of systems due to dementia.   Allergies  Penicillins  Home Medications   Prior to Admission medications   Medication Sig Start Date End Date Taking? Authorizing Provider  acetaminophen (TYLENOL) 650 MG CR tablet Take 650 mg by mouth 3 (three) times daily with meals.   Yes Historical Provider, MD  Ascorbic Acid (VITAMIN C) 1000 MG tablet Take 1,000 mg by mouth daily.   Yes Historical Provider, MD  aspirin EC 81 MG tablet Take 81 mg by mouth daily.   Yes Historical Provider, MD  donepezil (ARICEPT) 10 MG tablet Take 10 mg by mouth at bedtime.   Yes Historical Provider, MD  haloperidol (HALDOL) 5 MG tablet Take 5-10 mg by mouth at bedtime.   Yes Historical Provider, MD  Multiple Vitamins-Minerals (MULTIVITAL) tablet Take 1 tablet by mouth daily.   Yes Historical Provider, MD  Omega-3 Fatty Acids (FISH OIL) 1000 MG CAPS Take 1 capsule by mouth daily.   Yes Historical Provider, MD  sertraline (ZOLOFT) 50 MG tablet Take 1 tablet (50 mg total) by mouth daily. 10/28/11  Yes  Venia Carbon, MD  Tamsulosin HCl (FLOMAX) 0.4 MG CAPS Take 1 capsule (0.4 mg total) by mouth daily. 10/28/11  Yes Venia Carbon, MD   BP 125/76 mmHg  Pulse 71  Temp(Src) 98 F (36.7 C) (Oral)  Resp 15  Ht 5\' 11"  (1.803 m)  Wt 147 lb 14.4 oz (67.087 kg)  BMI 20.64 kg/m2  SpO2 100% Physical Exam  Constitutional:  The patient is an alert but debilitated appearing elderly gentleman. He is not in acute respiratory distress. He is somewhat pale in appearance. He is responding to questions but appears somewhat confused by them.  HENT:  Head: Normocephalic and atraumatic.  Nose: Nose normal.  Eyes: EOM are normal.  Cardiovascular:  Tachycardic distant heart sounds.  Pulmonary/Chest:  Effort normal and breath sounds normal.  Soft breath sounds at the bases.  Abdominal: Soft. He exhibits no distension and no mass. There is no guarding.  Patient endorses some mild discomfort to palpation in the epigastrium and the upper quadrants. There is no guarding present.  Musculoskeletal: He exhibits no edema or tenderness.  Neurological: He is alert.  Patient is mildly confused. He does follow commands with some assistance. No focal motor deficit.  Skin: Skin is warm and dry.  Psychiatric:  The patient is calm but confused.    ED Course  Procedures (including critical care time) Labs Review Labs Reviewed  BASIC METABOLIC PANEL - Abnormal; Notable for the following:    Glucose, Bld 200 (*)    BUN 26 (*)    GFR calc non Af Amer 52 (*)    GFR calc Af Amer 60 (*)    All other components within normal limits  CBC - Abnormal; Notable for the following:    WBC 2.3 (*)    RBC 3.88 (*)    Hemoglobin 12.0 (*)    HCT 36.9 (*)    Platelets 74 (*)    All other components within normal limits  BASIC METABOLIC PANEL - Abnormal; Notable for the following:    Sodium 133 (*)    Glucose, Bld 206 (*)    BUN 25 (*)    Calcium 8.1 (*)    GFR calc non Af Amer 56 (*)    GFR calc Af Amer 65 (*)    All other components within normal limits  CBC - Abnormal; Notable for the following:    RBC 3.32 (*)    Hemoglobin 10.3 (*)    HCT 31.2 (*)    Platelets 77 (*)    All other components within normal limits  TROPONIN I - Abnormal; Notable for the following:    Troponin I 3.27 (*)    All other components within normal limits  URINALYSIS, ROUTINE W REFLEX MICROSCOPIC - Abnormal; Notable for the following:    Specific Gravity, Urine <1.005 (*)    Hgb urine dipstick SMALL (*)    Bilirubin Urine SMALL (*)    Protein, ur 30 (*)    All other components within normal limits  TROPONIN I - Abnormal; Notable for the following:    Troponin I 3.45 (*)    All other components within normal limits   TROPONIN I - Abnormal; Notable for the following:    Troponin I 2.90 (*)    All other components within normal limits  GLUCOSE, CAPILLARY - Abnormal; Notable for the following:    Glucose-Capillary 153 (*)    All other components within normal limits  GLUCOSE, CAPILLARY - Abnormal; Notable for the following:  Glucose-Capillary 118 (*)    All other components within normal limits  GLUCOSE, CAPILLARY - Abnormal; Notable for the following:    Glucose-Capillary 105 (*)    All other components within normal limits  CBC - Abnormal; Notable for the following:    RBC 3.08 (*)    Hemoglobin 9.5 (*)    HCT 28.1 (*)    Platelets 92 (*)    All other components within normal limits  BASIC METABOLIC PANEL - Abnormal; Notable for the following:    BUN 33 (*)    Creatinine, Ser 1.73 (*)    Calcium 8.1 (*)    GFR calc non Af Amer 33 (*)    GFR calc Af Amer 39 (*)    Anion gap 16 (*)    All other components within normal limits  TROPONIN I - Abnormal; Notable for the following:    Troponin I 2.05 (*)    All other components within normal limits  GLUCOSE, CAPILLARY - Abnormal; Notable for the following:    Glucose-Capillary 109 (*)    All other components within normal limits  GLUCOSE, CAPILLARY - Abnormal; Notable for the following:    Glucose-Capillary 109 (*)    All other components within normal limits  CBC - Abnormal; Notable for the following:    RBC 3.12 (*)    Hemoglobin 9.5 (*)    HCT 28.9 (*)    Platelets 95 (*)    All other components within normal limits  BASIC METABOLIC PANEL - Abnormal; Notable for the following:    Potassium 3.6 (*)    BUN 29 (*)    Creatinine, Ser 1.80 (*)    Calcium 8.2 (*)    GFR calc non Af Amer 32 (*)    GFR calc Af Amer 37 (*)    All other components within normal limits  GLUCOSE, CAPILLARY - Abnormal; Notable for the following:    Glucose-Capillary 117 (*)    All other components within normal limits  HEPATIC FUNCTION PANEL - Abnormal;  Notable for the following:    Total Protein 5.2 (*)    Albumin 2.6 (*)    AST 39 (*)    ALT 170 (*)    Alkaline Phosphatase 129 (*)    Bilirubin, Direct 0.5 (*)    All other components within normal limits  GLUCOSE, CAPILLARY - Abnormal; Notable for the following:    Glucose-Capillary 112 (*)    All other components within normal limits  GLUCOSE, CAPILLARY - Abnormal; Notable for the following:    Glucose-Capillary 159 (*)    All other components within normal limits  GLUCOSE, CAPILLARY - Abnormal; Notable for the following:    Glucose-Capillary 107 (*)    All other components within normal limits  GLUCOSE, CAPILLARY - Abnormal; Notable for the following:    Glucose-Capillary 101 (*)    All other components within normal limits  GLUCOSE, CAPILLARY - Abnormal; Notable for the following:    Glucose-Capillary 138 (*)    All other components within normal limits  GLUCOSE, CAPILLARY - Abnormal; Notable for the following:    Glucose-Capillary 115 (*)    All other components within normal limits  CBC - Abnormal; Notable for the following:    RBC 3.43 (*)    Hemoglobin 10.7 (*)    HCT 32.7 (*)    All other components within normal limits  BASIC METABOLIC PANEL - Abnormal; Notable for the following:    Potassium 3.5 (*)    Glucose,  Bld 122 (*)    Creatinine, Ser 1.41 (*)    GFR calc non Af Amer 43 (*)    GFR calc Af Amer 50 (*)    All other components within normal limits  GLUCOSE, CAPILLARY - Abnormal; Notable for the following:    Glucose-Capillary 104 (*)    All other components within normal limits  I-STAT CG4 LACTIC ACID, ED - Abnormal; Notable for the following:    Lactic Acid, Venous 2.45 (*)    All other components within normal limits  I-STAT TROPOININ, ED - Abnormal; Notable for the following:    Troponin i, poc 0.10 (*)    All other components within normal limits  CULTURE, BLOOD (ROUTINE X 2)  CULTURE, BLOOD (ROUTINE X 2)  MRSA PCR SCREENING  CULTURE, BLOOD  (ROUTINE X 2)  URINE CULTURE  CULTURE, BLOOD (ROUTINE X 2)  CULTURE, BLOOD (ROUTINE X 2)  PROTIME-INR  LIPASE, BLOOD  HEMOGLOBIN A1C  URINE MICROSCOPIC-ADD ON  GLUCOSE, CAPILLARY  GLUCOSE, CAPILLARY  GLUCOSE, CAPILLARY  GLUCOSE, CAPILLARY  GLUCOSE, CAPILLARY  POCT ACTIVATED CLOTTING TIME    Imaging Review No results found.   EKG Interpretation   Date/Time:  Wednesday March 28 2014 19:28:19 EST Ventricular Rate:  113 PR Interval:  195 QRS Duration: 112 QT Interval:  337 QTC Calculation: 462 R Axis:   -47 Text Interpretation:  Sinus tachycardia LAD, consider left anterior  fascicular block Probable anteroseptal infarct, old Borderline ST  depression, lateral leads ED PHYSICIAN INTERPRETATION AVAILABLE IN [place]  [place] Confirmed by TEST, Record ([id number]) on 03/30/2014 8:03:10 AM      CRITICAL CARE Performed by: Charlesetta Shanks   Total critical care time: 30  Critical care time was exclusive of separately billable procedures and treating other patients.  Critical care was necessary to treat or prevent imminent or life-threatening deterioration.  Critical care was time spent personally by me on the following activities: development of treatment plan with patient and/or surrogate as well as nursing, discussions with consultants, evaluation of patient's response to treatment, examination of patient, obtaining history from patient or surrogate, ordering and performing treatments and interventions, ordering and review of laboratory studies, ordering and review of radiographic studies, pulse oximetry and re-evaluation of patient's condition.  MDM   Final diagnoses:  Fever, unspecified fever cause  Dementia, without behavioral disturbance  Acute coronary syndrome   The patient arrived with abnormal EKG suspicious for STEMI. This was reviewed with Dr. Terrence Dupont by phone conversation. The patient however had additional findings of fever to 103.8 and equivocal history based  on the patient's dementia. He also was DO NOT RESUSCITATE. Dr. Terrence Dupont did subsequently speak with the family and the decision was made to take the patient to the cath lab for interventional procedure.At the time of the patient's evaluation and treatment the exact source of fever has not been definitively identified.    Charlesetta Shanks, MD 04/02/14 [phone]

## 2014-03-28 NOTE — ED Notes (Signed)
Dr. Terrence Dupont notified of I stat troponin results by B. Yolanda Bonine, EMT

## 2014-03-28 NOTE — ED Notes (Signed)
Son and Daughter-in-law at bedside.

## 2014-03-28 NOTE — ED Notes (Signed)
Dr. Terrence Dupont on phone with son.

## 2014-03-28 NOTE — ED Notes (Signed)
Blood cultures drawn, re order put in per lab.

## 2014-03-28 NOTE — ED Notes (Signed)
Per EMS: patient began having chest pain and was jaundiced at the time of arrival at pt's home.  Pt had one episode of vomiting.  87% SpO2 on arrival, pt placed on 2L [place].  Pt was given 324mg  ASA, and Nitro Paste at 18:51.  Pt had NKA and PT IS A DNR.

## 2014-03-28 NOTE — CV Procedure (Signed)
Left cardiac cath/PTCA stenting report dictated on 03/28/2014 dictation number is [id number]

## 2014-03-28 NOTE — ED Notes (Signed)
Pt belongings and watch given to Muhanad, Torosyan (daughter in law. )

## 2014-03-28 NOTE — ED Notes (Signed)
Dr. Harwani at bedside. 

## 2014-03-28 NOTE — ED Notes (Signed)
Per Dr. Terrence Dupont, son has given verbal consent over the phone for cath lab

## 2014-03-28 NOTE — H&P (Signed)
[name] Kandee Keen Sr. is an 80 y.o. male.   Chief Complaint: chest pain HPI: patient is 80 year old male with past medical history significant for coronary artery disease history of MI 20+ years ago, dementia, benign hypertrophia of prostate, osteoarthritis, chronic tremors, questionable Parkinson's disease, came to the ER by EMS as code STEMI was called. Patient complained of retrosternal chest pain associated with diaphoresis and pale appearance followed by vomiting EKG done in the ED showed sinus tachycardia with Q waves with minimal ST elevation in V1 and more than 2 mm ST elevation in V2 and ST depression in V4 to V6. As per family patient has never complained of chest pain before. Patient has significant dementia and very hard to get history. Patient also was noted to have high-grade fever of 103 in ED. Per family patient has been occasionally coughing off-and-on. And was noted to have low-grade fever at home. Patient's activity is very limited but is able to walk at home. Discussed with patient's family at length various options of treatment i.e. Medical versus emergency left cath possible PTCA stenting its risk and benefits i.e. Death MI stroke local vascular complications etc. And family requested everything to be done.  Past Medical History  Diagnosis Date  . Tremor   . OA (osteoarthritis)   . Anxiety   . Dementia     POA is [name]  . Hearing loss   . Myocardial infarction   . Hypertrophy of prostate without urinary obstruction and other lower urinary tract symptoms (LUTS)   . Urinary incontinence     Past Surgical History  Procedure Laterality Date  . Previous ent surgery      Presumably Septoplasty  . Cholecystectomy    . Skin biopsy      face    Family History  Problem Relation Age of Onset  . Skin cancer Mother   . Breast cancer Daughter   . Arthritis Son   . Arthritis Son    Social History:  reports that he quit smoking about 53 years ago. He has never used  smokeless tobacco. He reports that he drinks about 1.0 oz of alcohol per week. He reports that he does not use illicit drugs.  Allergies:  Allergies  Allergen Reactions  . Penicillins     REACTION: Rash    Medications Prior to Admission  Medication Sig Dispense Refill  . acetaminophen (TYLENOL) 650 MG CR tablet Take 650 mg by mouth 3 (three) times daily with meals.    . Cyanocobalamin (VITAMIN B-12 PO) Take 1 capsule by mouth daily.      Marland Kitchen LORazepam (ATIVAN) 0.5 MG tablet Take 1 tablet (0.5 mg total) by mouth 3 (three) times daily as needed. For anxiety 90 tablet 0  . sertraline (ZOLOFT) 50 MG tablet Take 1 tablet (50 mg total) by mouth daily. 30 tablet 6  . Tamsulosin HCl (FLOMAX) 0.4 MG CAPS Take 1 capsule (0.4 mg total) by mouth daily. 30 capsule 6    Results for orders placed or performed during the hospital encounter of 03/28/14 (from the past 48 hour(s))  Basic metabolic panel     Status: Abnormal   Collection Time: 03/28/14  7:23 PM  Result Value Ref Range   Sodium 139 137 - 147 mEq/L   Potassium 4.1 3.7 - 5.3 mEq/L   Chloride 100 96 - 112 mEq/L   CO2 26 19 - 32 mEq/L   Glucose, Bld 200 (H) 70 - 99 mg/dL   BUN 26 (H) 6 -  23 mg/dL   Creatinine, Ser 1.21 0.50 - 1.35 mg/dL   Calcium 8.9 8.4 - 10.5 mg/dL   GFR calc non Af Amer 52 (L) >90 mL/min   GFR calc Af Amer 60 (L) >90 mL/min    Comment: (NOTE) The eGFR has been calculated using the CKD EPI equation. This calculation has not been validated in all clinical situations. eGFR's persistently <90 mL/min signify possible Chronic Kidney Disease.    Anion gap 13 5 - 15  CBC     Status: Abnormal   Collection Time: 03/28/14  7:23 PM  Result Value Ref Range   WBC 2.3 (L) 4.0 - 10.5 K/uL   RBC 3.88 (L) 4.22 - 5.81 MIL/uL   Hemoglobin 12.0 (L) 13.0 - 17.0 g/dL   HCT 36.9 (L) 39.0 - 52.0 %   MCV 95.1 78.0 - 100.0 fL   MCH 30.9 26.0 - 34.0 pg   MCHC 32.5 30.0 - 36.0 g/dL   RDW 13.3 11.5 - 15.5 %   Platelets 74 (L) 150 - 400  K/uL    Comment: REPEATED TO VERIFY SPECIMEN CHECKED FOR CLOTS PLATELET COUNT CONFIRMED BY SMEAR   Protime-INR (if pt is taking Coumadin)     Status: None   Collection Time: 03/28/14  7:23 PM  Result Value Ref Range   Prothrombin Time 14.3 11.6 - 15.2 seconds   INR 1.10 0.00 - 1.49  I-stat troponin, ED (not at [facility])     Status: Abnormal   Collection Time: 03/28/14  7:50 PM  Result Value Ref Range   Troponin i, poc 0.10 (HH) 0.00 - 0.08 ng/mL   Comment NOTIFIED PHYSICIAN    Comment 3            Comment: Due to the release kinetics of cTnI, a negative result within the first hours of the onset of symptoms does not rule out myocardial infarction with certainty. If myocardial infarction is still suspected, repeat the test at appropriate intervals.   I-Stat CG4 Lactic Acid, ED     Status: Abnormal   Collection Time: 03/28/14  7:52 PM  Result Value Ref Range   Lactic Acid, Venous 2.45 (H) 0.5 - 2.2 mmol/L   Dg Chest Port 1 View  (if Code Sepsis Called)  03/28/2014   CLINICAL DATA:  STEMI.  Chest pain and jaundice.  Vomiting.  EXAM: PORTABLE CHEST - 1 VIEW  COMPARISON:  12/05/2013  FINDINGS: The heart is enlarged. There are no focal consolidations or pleural effusions. No pulmonary edema. Shallow lung inflation. There is a healing left clavicle fracture. Significant chronic changes are identified in the shoulders bilaterally consistent with chronic rotator cuff injuries.  IMPRESSION: 1. Cardiomegaly. 2. No evidence for pulmonary edema.   Electronically Signed   By: Shon Hale M.D.   On: 03/28/2014 20:47    Review of Systems  Unable to perform ROS: dementia    Blood pressure 127/55, pulse 105, temperature 103.8 F (39.9 C), temperature source Rectal, resp. rate 24, SpO2 100 %. Physical Exam  Vitals reviewed. Constitutional: He is oriented to person, place, and time.  Eyes: Conjunctivae are normal. Left eye exhibits no discharge. No scleral icterus.  Neck: Neck supple. No JVD  present.  Cardiovascular:  Tachycardic S1 and S2 soft there was soft systolic murmur noted  Respiratory:  Decreased breath sound at bases  GI: Soft. Bowel sounds are normal. He exhibits no distension. There is no tenderness. There is no rebound.  Musculoskeletal: He exhibits no edema or tenderness.  Neurological: He is alert and oriented to person, place, and time.     Assessment/Plan Probable acute anteroseptal wall MI Coronary artery disease history of MI in the remote past Dementia Benign hypertrophia of prostate Osteoarthritis Tremors questionable history of Parkinson's disease Elevated blood sugar rule out diabetes mellitus Hyperpyrexia rule out sepsis Plan Discussed with patient's family at length regarding various options of treatment i.e. Medical versus invasive emergency left cath possible PTCA stenting in [place] benefits family wanted everything to be done and consented for PCI as above Fanchon Papania N 03/28/2014, 10:26 PM

## 2014-03-28 NOTE — ED Notes (Signed)
Cath lab called x3 for confirmation of preparedness

## 2014-03-29 DIAGNOSIS — I2109 ST elevation (STEMI) myocardial infarction involving other coronary artery of anterior wall: Secondary | ICD-10-CM | POA: Diagnosis not present

## 2014-03-29 LAB — GLUCOSE, CAPILLARY
Glucose-Capillary: 153 mg/dL — ABNORMAL HIGH (ref 70–99)
Glucose-Capillary: 118 mg/dL — ABNORMAL HIGH (ref 70–99)
Glucose-Capillary: 105 mg/dL — ABNORMAL HIGH (ref 70–99)
Glucose-Capillary: 109 mg/dL — ABNORMAL HIGH (ref 70–99)

## 2014-03-29 LAB — MRSA PCR SCREENING: MRSA BY PCR: NEGATIVE

## 2014-03-29 LAB — URINALYSIS, ROUTINE W REFLEX MICROSCOPIC
Glucose, UA: NEGATIVE mg/dL
Ketones, ur: NEGATIVE mg/dL
Leukocytes, UA: NEGATIVE
NITRITE: NEGATIVE
Protein, ur: 30 mg/dL — AB
UROBILINOGEN UA: 1 mg/dL (ref 0.0–1.0)
pH: 5 (ref 5.0–8.0)

## 2014-03-29 LAB — CBC
HEMATOCRIT: 31.2 % — AB (ref 39.0–52.0)
Hemoglobin: 10.3 g/dL — ABNORMAL LOW (ref 13.0–17.0)
MCH: 31 pg (ref 26.0–34.0)
MCHC: 33 g/dL (ref 30.0–36.0)
MCV: 94 fL (ref 78.0–100.0)
PLATELETS: 77 10*3/uL — AB (ref 150–400)
RBC: 3.32 MIL/uL — AB (ref 4.22–5.81)
RDW: 13.2 % (ref 11.5–15.5)
WBC: 5.4 10*3/uL (ref 4.0–10.5)

## 2014-03-29 LAB — URINE MICROSCOPIC-ADD ON

## 2014-03-29 LAB — TROPONIN I
TROPONIN I: 2.9 ng/mL — AB (ref <0.30)
TROPONIN I: 3.27 ng/mL — AB (ref <0.30)
Troponin I: 3.45 ng/mL (ref <0.30)

## 2014-03-29 LAB — BASIC METABOLIC PANEL
ANION GAP: 15 (ref 5–15)
BUN: 25 mg/dL — ABNORMAL HIGH (ref 6–23)
CALCIUM: 8.1 mg/dL — AB (ref 8.4–10.5)
CO2: 20 mEq/L (ref 19–32)
Chloride: 98 mEq/L (ref 96–112)
Creatinine, Ser: 1.13 mg/dL (ref 0.50–1.35)
GFR calc Af Amer: 65 mL/min — ABNORMAL LOW (ref >90)
GFR calc non Af Amer: 56 mL/min — ABNORMAL LOW (ref >90)
GLUCOSE: 206 mg/dL — AB (ref 70–99)
POTASSIUM: 3.8 meq/L (ref 3.7–5.3)
SODIUM: 133 meq/L — AB (ref 137–147)

## 2014-03-29 LAB — HEMOGLOBIN A1C
Hgb A1c MFr Bld: 4.8 % (ref <5.7)
Mean Plasma Glucose: 91 mg/dL (ref <117)

## 2014-03-29 LAB — LIPASE, BLOOD: LIPASE: 39 U/L (ref 11–59)

## 2014-03-29 LAB — POCT ACTIVATED CLOTTING TIME: Activated Clotting Time: 343 seconds

## 2014-03-29 MED ORDER — CETYLPYRIDINIUM CHLORIDE 0.05 % MT LIQD
7.0000 mL | Freq: Two times a day (BID) | OROMUCOSAL | Status: DC
  Administered 2014-03-29 – 2014-04-03 (×9): 7 mL via OROMUCOSAL

## 2014-03-29 MED ORDER — ATROPINE SULFATE 0.1 MG/ML IJ SOLN
INTRAMUSCULAR | Status: AC
  Filled 2014-03-29: qty 10

## 2014-03-29 MED ORDER — RAMIPRIL 2.5 MG PO CAPS
2.5000 mg | ORAL_CAPSULE | Freq: Every day | ORAL | Status: DC
  Administered 2014-03-29: 2.5 mg via ORAL
  Filled 2014-03-29 (×2): qty 1

## 2014-03-29 MED FILL — Sodium Chloride IV Soln 0.9%: INTRAVENOUS | Qty: 50 | Status: AC

## 2014-03-29 NOTE — Progress Notes (Signed)
CARDIAC REHAB PHASE I   PRE:  Rate/Rhythm: 80 SR  BP:  Supine:   Sitting: 99/56  Standing:    SaO2: 100%RA  MODE:  Ambulation: 80 ft   POST:  Rate/Rhythm: 92   BP:  Supine:   Sitting: 105/45  Standing:    SaO2: 94%RA 1130-1210 Pt very unsteady and needed asst x 2 with gait belt use and rolling walker to walk 80 ft. Grand daughter stated that distance farther than he walks at home. Does not want to use walker at home per grand daughter. To recliner after walk. No CP. Chair alarm on. Would recommend PT consult to address discharge needs and safety. Case manager also may want to meet with family as unclear if he has 24/7 supervision or not. High risk for fall.   Graylon Good, RN BSN  03/29/2014 12:07 PM

## 2014-03-29 NOTE — Progress Notes (Signed)
Rt femoral sheath removed. Pressure held for 20 minutes, no complications. Site is level 1. Pressure dressing applied, pt has dementia and family at bedside reminded of bedrest and leg restriction. Will monitor closely.

## 2014-03-29 NOTE — Progress Notes (Signed)
Echocardiogram Echocardiogram Transesophageal has been performed.  [name] [name] 03/29/2014, 12:36 PM

## 2014-03-29 NOTE — Plan of Care (Signed)
Problem: Phase I Progression Outcomes Goal: Anginal pain relieved Outcome: Completed/Met Date Met:  03/29/14 Goal: Aspirin unless contraindicated Outcome: Completed/Met Date Met:  03/29/14 Goal: Voiding-avoid urinary catheter unless indicated Outcome: Completed/Met Date Met:  03/29/14 Goal: Hemodynamically stable Outcome: Completed/Met Date Met:  03/29/14 Goal: Vascular site scale level 0 - I Vascular Site Scale Level 0: No bruising/bleeding/hematoma Level I (Mild): Bruising/Ecchymosis, minimal bleeding/ooozing, palpable hematoma < 3 cm Level II (Moderate): Bleeding not affecting hemodynamic parameters, pseudoaneurysm, palpable hematoma > 3 cm Level III (Severe) Bleeding which affects hemodynamic parameters or retroperitoneal hemorrhage  Outcome: Completed/Met Date Met:  03/29/14

## 2014-03-29 NOTE — Cardiovascular Report (Signed)
NAMENITHIN, DEMEO NO.:  [id number]  MEDICAL RECORD NO.:  [id number]  LOCATION:  2H14C                        FACILITY:  [facility]  PHYSICIAN:  Allegra Lai. Terrence Dupont, M.D. DATE OF BIRTH:  [birth year]  DATE OF PROCEDURE:  03/28/2014 DATE OF DISCHARGE:                           CARDIAC CATHETERIZATION   PROCEDURES: 1. Left cardiac catheterization with selective left and right coronary     angiography, measurement of LVEDP via right groin using Judkins     technique. 2. Successful percutaneous transluminal coronary angioplasty to     proximal and mid junction  of left anterior descending using 2.5 x     12 mm long Euphora balloon and then 3.0 x 15 mm long [place] Trek     balloon. 3. Successful deployment of 3.0 x 18 mm long Xience Alpine drug-     eluting stent in proximal and mid junction of left anterior     descending. 4. Successful postdilatation of this stent using 3.0 x 15 mm long [place]     Trek balloon.  INDICATION FOR THE PROCEDURE:  [name] [name] is an 80 year old male with past medical history significant for coronary artery disease, history of MI 20+ years ago, dementia, benign hypertrophy of the prostate, osteoarthritis, chronic tremors questionable Parkinson disease.  He came to the ER by EMS as code STEMI was called.  The patient complained of retrosternal chest pain associated with diaphoresis and pale appearance followed by vomiting.  EKG done in the ED showed sinus tach with Q-waves with minimal ST elevation in V1 and eventually ST-elevation in V2 and ST depression in V4 to V6.  Per family, the patient has been complaining of chest pain in the floor. The patient also has significant dementia and they are to get any history from the patient.  The patient also noted to have high-grade fever of 103 in the ED.  Per family, the patient had been coughing off and on and had low-grade fever at home.  The patient's activity is very limited, but he is able to  walk at home.  Discussed with the patient's family at length  various options of treatment, i.e., medical versus emergency left cath, possible PTCA and stenting, its risks and benefits, i.e., death, MI, stroke, local vascular complications, etc. and they wanted everything to be done.  OPERATIVE COURSE:  After obtaining the informed consent, the patient was brought to the cath lab and was placed on fluoroscopy table.  Right groin was prepped and draped in usual fashion.  A 1% Xylocaine was used for local anesthesia in the right groin.  With the help of thin wall needle, a 6-French arterial sheath was placed.  The sheath was aspirated and flushed. The sheath was exchanged to long sheath as the patient had very tortuous iliac artery and then to 45 cm long sheath.  Next, a 6-French left Judkins catheter was advanced over the wire under fluoroscopic guidance up to the ascending aorta.  Wire was pulled out.  The catheter was aspirated and connected to the Manifold.  Catheter was further advanced and engaged into left coronary ostium.  Multiple views of the left system were taken.  Next, catheter was  disengaged and was pulled out over the wire and was replaced with 6-French right Judkins catheter, which was advanced over the wire under fluoroscopic guidance up to the ascending aorta.  Wire was pulled out.  The catheter was aspirated and connected to the Manifold.  Catheter was further advanced and engaged into right coronary ostium.  Multiple views of the right system were taken.  Next, catheter was disengaged and was pulled out over the wire and was replaced with 6-French pigtail catheter at the end of the procedure, which was advanced over the wire under fluoroscopic guidance up to the ascending aorta.  Catheter was further advanced across the aortic valve into the LV.  LV pressures were recorded.  Next, LVEDP pressures were measured which was 30 mmHg.  At the end of the procedure, pigtail  catheter was pulled out from LV.  There was no gradient across the aortic valve.  Next, pigtail catheter was pulled out over the wire. Sheaths were aspirated and flushed.  At the end of the procedure, 45- French sheath long was replaced with regular 5 small 6-French sheath.  FINDINGS:  Left main was patent.  LAD had 30-40% proximal stenosis and then 85-90% proximal and mid junction stenosis just beyond the diagonal 3 with haziness and TIMI grade 3 distal flow.  Diagonal 1 and 2 were very small which has mild disease.  Diagonal 3 has 50-60% proximal stenosis.  Left circumflex has 20-25% proximal stenosis.  OM1 to OM 3 had mild diseased.  RCA has 30-40% proximal and mid stenosis and then was 100% occluded beyond the midportion.  Distally, RCA was filling by left system via the collaterals.  INTERVENTIONAL PROCEDURE:  Successful PTCA to proximal and mid junction of LAD was done using 2.5 x 12 mm long Euphora balloon for predilatation and then 3.0 x 15 mm long [place] Trek balloon going up to 14 atmospheric pressure for predilatation and then 3.0 x 18 mm long Xience Alpine drug- eluting stent was deployed at 11 atmospheric pressure.  The stent was post dilated using 3.0 x 15 mm long [place] Trek balloon going up to 21 atmospheric pressure.  Lesion dilated from 85-90% to 0% residual with excellent TIMI grade 3 distal flow without evidence of dissection or distal embolization.  The patient received weight based Angiomax and 180 mg of Brilinta during the procedure.  The patient tolerated the procedure well.  There were no complications.  The patient was transferred to CCU in stable condition.     Allegra Lai. Terrence Dupont, M.D.     MNH/MEDQ  D:  03/28/2014  T:  03/29/2014  Job:  [id number]

## 2014-03-29 NOTE — Progress Notes (Signed)
Received phone call from [facility] via [place] lab stating that pt's blood cultures drawn in 3 vials on 11/4 were found to be growing gram negative rods. Dr. Terrence Dupont notified via phone call.

## 2014-03-29 NOTE — Progress Notes (Signed)
Subjective:  Doing well. Denies any chest pain or shortness of breath eating breakfast. Coughing and fever has improved. Troponin I's are mildly elevated.  Objective:  Vital Signs in the last 24 hours: Temp:  [98.2 F (36.8 C)-103.8 F (39.9 C)] 98.2 F (36.8 C) (11/05 0700) Pulse Rate:  [82-132] 82 (11/05 0700) Resp:  [16-30] 22 (11/05 0700) BP: (87-185)/(40-151) 96/41 mmHg (11/05 0700) SpO2:  [92 %-100 %] 99 % (11/05 0700) Arterial Line BP: (105-138)/(44-63) 109/49 mmHg (11/05 0235) Weight:  [67.3 kg (148 lb 5.9 oz)] 67.3 kg (148 lb 5.9 oz) (11/04 2300)  Intake/Output from previous day: 11/04 0701 - 11/05 0700 In: 800 [I.V.:500; IV Piggyback:300] Out: 400 [Urine:400] Intake/Output from this shift: Total I/O In: -  Out: 125 [Urine:125]  Physical Exam: Neck: no adenopathy, no carotid bruit, no JVD and supple, symmetrical, trachea midline Lungs: decrease breath sound at bases with occasional rhonchi Heart: regular rate and rhythm, S1, S2 normal and soft systolic murmur noted Abdomen: soft, non-tender; bowel sounds normal; no masses,  no organomegaly Extremities: extremities normal, atraumatic, no cyanosis or edema and right groin dressing dry no hematoma  Lab Results:  Recent Labs  03/28/14 1923 03/29/14 0002  WBC 2.3* 5.4  HGB 12.0* 10.3*  PLT 74* 77*    Recent Labs  03/28/14 1923 03/29/14 0002  NA 139 133*  K 4.1 3.8  CL 100 98  CO2 26 20  GLUCOSE 200* 206*  BUN 26* 25*  CREATININE 1.21 1.13    Recent Labs  03/29/14 0002 03/29/14 0738  TROPONINI 3.27* 3.45*   Hepatic Function Panel No results for input(s): PROT, ALBUMIN, AST, ALT, ALKPHOS, BILITOT, BILIDIR, IBILI in the last 72 hours. No results for input(s): CHOL in the last 72 hours. No results for input(s): PROTIME in the last 72 hours.  Imaging: Imaging results have been reviewed and Dg Chest Port 1 View  (if Code Sepsis Called)  03/28/2014   CLINICAL DATA:  STEMI.  Chest pain and jaundice.   Vomiting.  EXAM: PORTABLE CHEST - 1 VIEW  COMPARISON:  12/05/2013  FINDINGS: The heart is enlarged. There are no focal consolidations or pleural effusions. No pulmonary edema. Shallow lung inflation. There is a healing left clavicle fracture. Significant chronic changes are identified in the shoulders bilaterally consistent with chronic rotator cuff injuries.  IMPRESSION: 1. Cardiomegaly. 2. No evidence for pulmonary edema.   Electronically Signed   By: Shon Hale M.D.   On: 03/28/2014 20:47    Cardiac Studies:  Assessment/Plan:  Status postacute anteroseptal wall MI status post PCI to proximal and mid junction of the LAD with excellent results Coronary artery disease history of MI in the remote past Dementia Benign hypertrophia of prostate Osteoarthritis Tremors questionable history of Parkinson's disease New-onset diabetes mellitus Bronchitis pancytopenia Plan Check labs Check cultures Will adjust antibiotics tomorrow once culture results available Check 2-D echo Out of bed to chair Phase I cardiac rehabilitation.   LOS: 1 day    Hawraa Stambaugh N 03/29/2014, 9:41 AM

## 2014-03-29 NOTE — Progress Notes (Signed)
Utilization review completed. Ghislaine Harcum, RN, BSN. 

## 2014-03-29 NOTE — Cardiovascular Report (Signed)
NAMEALECK, LOCKLIN NO.:  [id number]  MEDICAL RECORD NO.:  [id number]  LOCATION:  2H14C                        FACILITY:  [facility]  PHYSICIAN:  Allegra Lai. Terrence Dupont, M.D. DATE OF BIRTH:  06-24-1923  DATE OF PROCEDURE:  03/29/2014 DATE OF DISCHARGE:                           CARDIAC CATHETERIZATION   ADDENDUM  Briefly, Mr. Wyndham is 80 year old male came to the ER with STEMI due to multiple medical problems, and dementia.  Code STEMI initially was canceled by EDP, following further discussion with family, family requested everything should be done and consented for emergency PCI. The patient was taken to the cath lab and subsequently had angiogram which showed critical proximal and mid junction LAD stenosis, although patient had TIMI grade 3 distal flow in the vessel.  The patient had very tortuous aorta and iliac vessels.  Initial sheath was exchanged to 25 cm long sheath, despite that there was no guiding support, this sheath was exchanged to 45 cm sheath requiring multiple exchanges of guiding catheter.  [place] balloon time was 54 minutes, in the cath lab, the reason being because of multiple catheter and guiding multiple diagnostic sheaths, exchanges, and guiding catheter changes and also at the same time, patient had TIMI-3 distal flow in LAD and was not felt is emergency to do the percutaneous intervention without appropriate equipment.  The patient did tolerate the procedure very well.  There were no complications.     Allegra Lai. Terrence Dupont, M.D.     MNH/MEDQ  D:  03/29/2014  T:  03/29/2014  Job:  [id number]

## 2014-03-30 ENCOUNTER — Inpatient Hospital Stay (HOSPITAL_COMMUNITY): Payer: Medicare PPO

## 2014-03-30 DIAGNOSIS — I249 Acute ischemic heart disease, unspecified: Secondary | ICD-10-CM

## 2014-03-30 DIAGNOSIS — R509 Fever, unspecified: Secondary | ICD-10-CM

## 2014-03-30 DIAGNOSIS — N400 Enlarged prostate without lower urinary tract symptoms: Secondary | ICD-10-CM

## 2014-03-30 DIAGNOSIS — A499 Bacterial infection, unspecified: Secondary | ICD-10-CM

## 2014-03-30 DIAGNOSIS — R7881 Bacteremia: Secondary | ICD-10-CM

## 2014-03-30 LAB — CBC
HCT: 28.1 % — ABNORMAL LOW (ref 39.0–52.0)
HEMOGLOBIN: 9.5 g/dL — AB (ref 13.0–17.0)
MCH: 30.8 pg (ref 26.0–34.0)
MCHC: 33.8 g/dL (ref 30.0–36.0)
MCV: 91.2 fL (ref 78.0–100.0)
Platelets: 92 10*3/uL — ABNORMAL LOW (ref 150–400)
RBC: 3.08 MIL/uL — ABNORMAL LOW (ref 4.22–5.81)
RDW: 13.6 % (ref 11.5–15.5)
WBC: 6.9 10*3/uL (ref 4.0–10.5)

## 2014-03-30 LAB — BASIC METABOLIC PANEL
Anion gap: 16 — ABNORMAL HIGH (ref 5–15)
BUN: 33 mg/dL — AB (ref 6–23)
CALCIUM: 8.1 mg/dL — AB (ref 8.4–10.5)
CO2: 22 meq/L (ref 19–32)
CREATININE: 1.73 mg/dL — AB (ref 0.50–1.35)
Chloride: 99 mEq/L (ref 96–112)
GFR calc Af Amer: 39 mL/min — ABNORMAL LOW (ref >90)
GFR, EST NON AFRICAN AMERICAN: 33 mL/min — AB (ref >90)
GLUCOSE: 96 mg/dL (ref 70–99)
Potassium: 4.1 mEq/L (ref 3.7–5.3)
Sodium: 137 mEq/L (ref 137–147)

## 2014-03-30 LAB — GLUCOSE, CAPILLARY
GLUCOSE-CAPILLARY: 117 mg/dL — AB (ref 70–99)
Glucose-Capillary: 87 mg/dL (ref 70–99)
Glucose-Capillary: 109 mg/dL — ABNORMAL HIGH (ref 70–99)
Glucose-Capillary: 86 mg/dL (ref 70–99)

## 2014-03-30 LAB — TROPONIN I: Troponin I: 2.05 ng/mL (ref <0.30)

## 2014-03-30 MED ORDER — VANCOMYCIN HCL IN DEXTROSE 750-5 MG/150ML-% IV SOLN
750.0000 mg | INTRAVENOUS | Status: DC
  Filled 2014-03-30: qty 150

## 2014-03-30 MED ORDER — BISACODYL 5 MG PO TBEC: 5.0000 mg | DELAYED_RELEASE_TABLET | Freq: Every day | ORAL | Status: DC | PRN

## 2014-03-30 MED ORDER — FERROUS SULFATE 325 (65 FE) MG PO TABS
325.0000 mg | ORAL_TABLET | Freq: Three times a day (TID) | ORAL | Status: DC
  Administered 2014-03-31 – 2014-04-04 (×11): 325 mg via ORAL
  Filled 2014-03-30 (×12): qty 1

## 2014-03-30 NOTE — Progress Notes (Signed)
Subjective:  Patient denies any chest pain or shortness of breath states feeling better. EKG shows evolving anteroseptal wall MI cardiac enzymes are trending down. Patient had significant elevation of creatinine due to hypotension/ contrast-induced/ACE inhibitor/vancomycin. Case has been DC'd vancomycin dose has been adjusted and started on slow hydration. Patient remains afebrile blood cultures 3 shows gram negative rods final culture pending source at present unclear. Transthoracic 2-D echo showed no obvious vegetation. Discussed briefly with family regarding TEE. Patient has remained afebrile. Will repeat blood cultures and get infectious disease input.  Objective:  Vital Signs in the last 24 hours: Temp:  [97.4 F (36.3 C)-98.4 F (36.9 C)] 97.5 F (36.4 C) (11/06 1200) Pulse Rate:  [69-97] 73 (11/06 1200) Resp:  [13-31] 17 (11/06 1200) BP: (81-122)/(25-88) 95/45 mmHg (11/06 1200) SpO2:  [96 %-100 %] 98 % (11/06 1200) Weight:  [66.316 kg (146 lb 3.2 oz)] 66.316 kg (146 lb 3.2 oz) (11/06 0400)  Intake/Output from previous day: 11/05 0701 - 11/06 0700 In: 1452.5 [P.O.:600; I.V.:502.5; IV Piggyback:350] Out: 625 [Urine:625] Intake/Output from this shift: Total I/O In: 448.3 [P.O.:360; I.V.:88.3] Out: 100 [Urine:100]  Physical Exam: Neck: no adenopathy, no carotid bruit, no JVD and supple, symmetrical, trachea midline Lungs: decreased breath sound at bases with occasional rhonchi Heart: regular rate and rhythm, S1, S2 normal and soft systolic murmur noted  Abdomen: soft, non-tender; bowel sounds normal; no masses,  no organomegaly Extremities: extremities normal, atraumatic, no cyanosis or edema and right groin stable  Lab Results:  Recent Labs  03/29/14 0002 03/30/14 0225  WBC 5.4 6.9  HGB 10.3* 9.5*  PLT 77* 92*    Recent Labs  03/29/14 0002 03/30/14 0225  NA 133* 137  K 3.8 4.1  CL 98 99  CO2 20 22  GLUCOSE 206* 96  BUN 25* 33*  CREATININE 1.13 1.73*     Recent Labs  03/29/14 1314 03/30/14 0225  TROPONINI 2.90* 2.05*   Hepatic Function Panel No results for input(s): PROT, ALBUMIN, AST, ALT, ALKPHOS, BILITOT, BILIDIR, IBILI in the last 72 hours. No results for input(s): CHOL in the last 72 hours. No results for input(s): PROTIME in the last 72 hours.  Imaging: Imaging results have been reviewed and Dg Chest Port 1 View  (if Code Sepsis Called)  03/28/2014   CLINICAL DATA:  STEMI.  Chest pain and jaundice.  Vomiting.  EXAM: PORTABLE CHEST - 1 VIEW  COMPARISON:  12/05/2013  FINDINGS: The heart is enlarged. There are no focal consolidations or pleural effusions. No pulmonary edema. Shallow lung inflation. There is a healing left clavicle fracture. Significant chronic changes are identified in the shoulders bilaterally consistent with chronic rotator cuff injuries.  IMPRESSION: 1. Cardiomegaly. 2. No evidence for pulmonary edema.   Electronically Signed   By: Shon Hale M.D.   On: 03/28/2014 20:47    Cardiac Studies:  Assessment/Plan:  Status post acute anteroseptal wall MI status post PCI to proximal and mid junction of the LAD with excellent results Coronary artery disease history of MI in the remote past Dementia Gram-negative bacteremia questionable source Acute on chronic injury secondary to CIN/hypotension/ACE inhibitor/antibiotics Acute on chronic anemia secondary to hydration rule out GI loss Benign hypertrophy of prostate Osteoarthritis Tremors questionable history of Parkinson's disease New-onset diabetes mellitus Bronchitis Pancytopenia  Plan Check labs in a.m. Next check chest x-ray Check repeat blood cultures ID consult May need TEE Check final cultures Dr Doylene Canard on call for weekend  LOS: 2 days    [facility]  N 03/30/2014, 12:34 PM

## 2014-03-30 NOTE — Progress Notes (Signed)
PT Cancellation Note  Patient Details Name: [name] Pudlo Sr. MRN: [id number] DOB: Jun 29, 1923   Cancelled Treatment:    Reason Eval/Treat Not Completed: Patient declined, no reason specified (despite max encouragement from family. Family reports pt was awake all last night.) Will try again later today.   Adrena Nakamura 03/30/2014, 10:58 AM  Suanne Marker PT [phone]

## 2014-03-30 NOTE — Plan of Care (Signed)
Problem: Consults Goal: MI Patient Education (See Patient Education module for education specifics.)  Outcome: Progressing  Problem: Phase I Progression Outcomes Goal: Initial discharge plan identified Outcome: Progressing  Problem: Phase II Progression Outcomes Goal: Anginal pain absent Outcome: Completed/Met Date Met:  03/30/14 Goal: Wean IV nitroglycerin to PO or topical Outcome: Progressing Goal: Discharge plan established Outcome: Progressing

## 2014-03-30 NOTE — Progress Notes (Signed)
[place] for vancomycin and Zosyn Indication: sepsis  Allergies  Allergen Reactions  . Penicillins     REACTION: Rash    Patient Measurements: Height: 5\' 11"  (180.3 cm) Weight: 146 lb 3.2 oz (66.316 kg) IBW/kg (Calculated) : 75.3   Vital Signs: Temp: 98.4 F (36.9 C) (11/06 0700) Temp Source: Oral (11/06 0700) BP: 98/57 mmHg (11/06 0700) Pulse Rate: 78 (11/06 0700) Intake/Output from previous day: 11/05 0701 - 11/06 0700 In: 1452.5 [P.O.:600; I.V.:502.5; IV Piggyback:350] Out: 625 [Urine:625] Intake/Output from this shift:    Labs:  Recent Labs  03/28/14 1923 03/29/14 0002 03/30/14 0225  WBC 2.3* 5.4 6.9  HGB 12.0* 10.3* 9.5*  PLT 74* 77* 92*  CREATININE 1.21 1.13 1.73*   Estimated Creatinine Clearance: 27.7 mL/min (by C-G formula based on Cr of 1.73). No results for input(s): VANCOTROUGH, VANCOPEAK, VANCORANDOM, GENTTROUGH, GENTPEAK, GENTRANDOM, TOBRATROUGH, TOBRAPEAK, TOBRARND, AMIKACINPEAK, AMIKACINTROU, AMIKACIN in the last 72 hours.   Microbiology: Recent Results (from the past 720 hour(s))  Blood culture (routine x 2)     Status: None (Preliminary result)   Collection Time: 03/28/14  7:30 PM  Result Value Ref Range Status   Specimen Description BLOOD FOREARM RIGHT  Final   Special Requests BOTTLES DRAWN AEROBIC AND ANAEROBIC 5CC  Final   Culture  Setup Time   Final    03/29/2014 03:47 Performed at [facility]    Culture   Final    GRAM NEGATIVE RODS Note: Gram Stain Report Called to,Read Back By and Verified With: CHARIS YIMBU 03/29/14 1435 BY SMITHERSJ Performed at [facility]    Report Status PENDING  Incomplete  MRSA PCR Screening     Status: None   Collection Time: 03/28/14 11:02 PM  Result Value Ref Range Status   MRSA by PCR NEGATIVE NEGATIVE Final    Comment:        The GeneXpert MRSA Assay (FDA approved for NASAL specimens only), is one component of a comprehensive MRSA  colonization surveillance program. It is not intended to diagnose MRSA infection nor to guide or monitor treatment for MRSA infections.   Blood culture (routine x 2)     Status: None (Preliminary result)   Collection Time: 03/28/14 11:25 PM  Result Value Ref Range Status   Specimen Description BLOOD LEFT UPPER WRIST  Final   Special Requests BOTTLES DRAWN AEROBIC ONLY 10CC  Final   Culture  Setup Time   Final    03/29/2014 03:47 Performed at [facility]    Culture   Final    [place] Note: Gram Stain Report Called to,Read Back By and Verified With: CHARIS YIMBU 03/29/14 1435 BY SMITHERSJ Performed at [facility]    Report Status PENDING  Incomplete  Culture, blood (routine x 2)     Status: None (Preliminary result)   Collection Time: 03/29/14 12:03 AM  Result Value Ref Range Status   Specimen Description BLOOD RIGHT ARM  Final   Special Requests BOTTLES DRAWN AEROBIC AND ANAEROBIC 8CC  Final   Culture  Setup Time   Final    03/29/2014 03:47 Performed at [facility]    Culture   Final    GRAM NEGATIVE RODS Note: Gram Stain Report Called to,Read Back By and Verified With: CHARIS YIMBU 03/29/14 1435 BY SMITHERSJ Performed at [facility]    Report Status PENDING  Incomplete    Medical History: Past Medical History  Diagnosis Date  .  Tremor   . OA (osteoarthritis)   . Anxiety   . Dementia     POA is [name]  . Hearing loss   . Myocardial infarction   . Hypertrophy of prostate without urinary obstruction and other lower urinary tract symptoms (LUTS)   . Urinary incontinence     Assessment: 89 YOM with dementia brought in as code stemi and taken to cath lab.  Patient now afebrille with normal wbc count, continues on broad antibiotics. Blood cxs positive for GNR not yet speciated. Scr has bumped this am 1.1>>1.7. Will adjust vancomycin dosing but zosyn to be left at current dosing.  Goal of Therapy:  Vancomycin  trough level 15-20 mcg/ml  Plan:  1. Continue Zosyn 3.375g IV q8h EI dosing 2. Decrease Vancomycin 750mg  IV q24h  3. Follow up c/s, clinical progression, renal function, trough at [facility] PharmD., BCPS Clinical Pharmacist Pager [phone] 03/30/2014 9:57 AM

## 2014-03-30 NOTE — Consult Note (Signed)
[facility] for Infectious Disease     Reason for Consult: GNR bacteremia    Referring Physician: Dr. Terrence Dupont  Active Problems:   Acute coronary syndrome   . antiseptic oral rinse  7 mL Mouth Rinse BID  . aspirin  81 mg Oral Daily  . atorvastatin  40 mg Oral q1800  . insulin aspart  0-9 Units Subcutaneous TID WC  . metoprolol tartrate  12.5 mg Oral BID  . piperacillin-tazobactam (ZOSYN)  IV  3.375 g Intravenous 3 times per day  . sertraline  50 mg Oral Daily  . tamsulosin  0.4 mg Oral Daily  . ticagrelor  90 mg Oral BID    Recommendations: Repeat blood cultures (done) Ultrasound abd D/c vancomycin Narrow antibiotics based on ID when available   Assessment: He has GNR bacteremia.  UA not impressive to suggest urinary source, no other symptoms,  Has an enlarged prostate, no GI or pulmonary symptoms.  Antibiotics: Zosyn vancomcyin  HPI: [name] Niess Sr. is a 80 y.o. male with Demential, history of MI, presented 11/4 with fever to 103 and STEMI.  Cathed and stented.  3/3 blood cultures with GNR.  Patient feels much better with no further fever over 24 hours, no chills.  Has had no abdominal pain, urinary complaints leading up to the illness.  Has a penicillin allergy listed but is tolerating zosyn.     Review of Systems: A comprehensive review of systems was negative.  Past Medical History  Diagnosis Date  . Tremor   . OA (osteoarthritis)   . Anxiety   . Dementia     POA is [name]  . Hearing loss   . Myocardial infarction   . Hypertrophy of prostate without urinary obstruction and other lower urinary tract symptoms (LUTS)   . Urinary incontinence     History  Substance Use Topics  . Smoking status: Former Smoker -- 1.00 packs/day for 20 years    Quit date: 05/25/1960  . Smokeless tobacco: Never Used     Comment: quit over 50 years ago  . Alcohol Use: No     Comment: wine occassionally    Family History  Problem Relation Age of Onset  .  Skin cancer Mother   . Breast cancer Daughter   . Arthritis Son   . Arthritis Son    Allergies  Allergen Reactions  . Penicillins     REACTION: Rash    OBJECTIVE: Blood pressure 95/45, pulse 73, temperature 97.5 F (36.4 C), temperature source Oral, resp. rate 17, height 5\' 11"  (1.803 m), weight 146 lb 3.2 oz (66.316 kg), SpO2 98 %. General: awake, alert, nad Skin: no rashes Lungs: CTA B Cor: RRR Abdomen: soft, nt, nd, +bs Ext: no edema  Microbiology: Recent Results (from the past 240 hour(s))  Blood culture (routine x 2)     Status: None (Preliminary result)   Collection Time: 03/28/14  7:30 PM  Result Value Ref Range Status   Specimen Description BLOOD FOREARM RIGHT  Final   Special Requests BOTTLES DRAWN AEROBIC AND ANAEROBIC 5CC  Final   Culture  Setup Time   Final    03/29/2014 03:47 Performed at [facility]    Culture   Final    GRAM NEGATIVE RODS Note: Gram Stain Report Called to,Read Back By and Verified With: CHARIS YIMBU 03/29/14 1435 BY SMITHERSJ Performed at [facility]    Report Status PENDING  Incomplete  MRSA PCR Screening     Status:  None   Collection Time: 03/28/14 11:02 PM  Result Value Ref Range Status   MRSA by PCR NEGATIVE NEGATIVE Final    Comment:        The GeneXpert MRSA Assay (FDA approved for NASAL specimens only), is one component of a comprehensive MRSA colonization surveillance program. It is not intended to diagnose MRSA infection nor to guide or monitor treatment for MRSA infections.   Blood culture (routine x 2)     Status: None (Preliminary result)   Collection Time: 03/28/14 11:25 PM  Result Value Ref Range Status   Specimen Description BLOOD LEFT UPPER WRIST  Final   Special Requests BOTTLES DRAWN AEROBIC ONLY 10CC  Final   Culture  Setup Time   Final    03/29/2014 03:47 Performed at [facility] Note: Gram Stain Report Called to,Read Back By and  Verified With: CHARIS YIMBU 03/29/14 1435 BY SMITHERSJ Performed at [facility]    Report Status PENDING  Incomplete  Culture, blood (routine x 2)     Status: None (Preliminary result)   Collection Time: 03/29/14 12:03 AM  Result Value Ref Range Status   Specimen Description BLOOD RIGHT ARM  Final   Special Requests BOTTLES DRAWN AEROBIC AND ANAEROBIC 8CC  Final   Culture  Setup Time   Final    03/29/2014 03:47 Performed at [facility] Note: Gram Stain Report Called to,Read Back By and Verified With: CHARIS YIMBU 03/29/14 1435 BY SMITHERSJ Performed at [facility]    Report Status PENDING  Incomplete  Culture, Urine     Status: None (Preliminary result)   Collection Time: 03/29/14  3:00 AM  Result Value Ref Range Status   Specimen Description URINE, RANDOM  Final   Special Requests Normal  Final   Culture  Setup Time   Final    03/29/2014 11:02 Performed at [facility] PENDING  Incomplete   Culture   Final    Culture reincubated for better growth Performed at [facility]    Report Status PENDING  Incomplete    [place], [place] for Infectious Disease [facility] [email or web address] [phone] pager  [phone] cell 03/30/2014, 2:54 PM

## 2014-03-30 NOTE — Progress Notes (Signed)
EKG CRITICAL VALUE     12 lead EKG performed.  Critical value noted.  Lillia Mountain, RN notified.   Ngina Royer, [place], CCT 03/30/2014 6:55 AM

## 2014-03-30 NOTE — Clinical Documentation Improvement (Signed)
Please specify diagnosis (type of diabetes and any underlying conditions) related to the below supporting documentation/ clinical indicators if appropriate.  Possible Diagnosis  DM Type 1 with hyperglycemia, uncontrolled DM Type 2 with hyperglycemia DM Type 2 with hyperglycemia, uncontrolled DM Type 2 with hyperglycemia, controlled Other (specify) Unable to Clinically Determine   Supporting Documentation /  Clinical Indicators:  Per 03/29/14 MD progress notes= New-onset diabetes mellitus. Per 03/28/14 H&P = Elevated blood sugar rule out diabetes mellitus.       Patient's labs during current admission  Component     Latest Ref Rng 03/28/2014         7:23 PM  Glucose     70 - 99 mg/dL 200 (H)   Component     Latest Ref Rng 03/29/2014        12:02 AM  Glucose     70 - 99 mg/dL 206 (H)    Component     Latest Ref Rng 03/30/2014          Glucose     70 - 99 mg/dL 96    Thank You,  Rozetta Nunnery, BSN, CCM Clinical Documentation Specialist Phone: [phone]

## 2014-03-30 NOTE — Evaluation (Signed)
Physical Therapy Evaluation Patient Details Name: [name] Kidane Sr. MRN: [id number] DOB: [birth year] Today's Date: 03/30/2014   History of Present Illness  Pt adm with STEMI. Underwent cath with stent. PMH - dementia, CAD, ?MI  Clinical Impression  Pt admitted with above. Pt currently with functional limitations due to the deficits listed below (see PT Problem List).  Pt will benefit from skilled PT to increase their independence and safety with mobility to allow discharge to the venue listed below.       Follow Up Recommendations SNF    Equipment Recommendations  Rolling walker with 5" wheels    Recommendations for Other Services       Precautions / Restrictions Precautions Precautions: Fall      Mobility  Bed Mobility Overal bed mobility: Needs Assistance Bed Mobility: Supine to Sit     Supine to sit: Min assist     General bed mobility comments: assist to bring trunk up.  Transfers Overall transfer level: Needs assistance Equipment used: Rolling walker (2 wheeled) Transfers: Sit to/from Stand Sit to Stand: Min assist         General transfer comment: verbal cues for hand placment and assist to bring hips up.  Ambulation/Gait Ambulation/Gait assistance: Min assist Ambulation Distance (Feet): 100 Feet Assistive device: Rolling walker (2 wheeled) Gait Pattern/deviations: Step-through pattern;Decreased step length - right;Decreased step length - left;Shuffle;Trunk flexed     General Gait Details: Verbal cues to stand more erect and look up. Assist for balance.  Stairs            Wheelchair Mobility    Modified Rankin (Stroke Patients Only)       Balance Overall balance assessment: Needs assistance Sitting-balance support: No upper extremity supported;Feet supported Sitting balance-Leahy Scale: Fair     Standing balance support: Bilateral upper extremity supported Standing balance-Leahy Scale: Poor Standing balance comment: Assist and walker  for static standing.                             Pertinent Vitals/Pain Pain Assessment: No/denies pain    Home Living Family/patient expects to be discharged to:: Skilled nursing facility                      Prior Function Level of Independence: Needs assistance   Gait / Transfers Assistance Needed: Was amb without assistive device but with falls.           Hand Dominance        Extremity/Trunk Assessment   Upper Extremity Assessment: Overall WFL for tasks assessed           Lower Extremity Assessment: Generalized weakness         Communication      Cognition Arousal/Alertness: Awake/alert Behavior During Therapy: WFL for tasks assessed/performed Overall Cognitive Status: History of cognitive impairments - at baseline                      General Comments      Exercises        Assessment/Plan    PT Assessment Patient needs continued PT services  PT Diagnosis Difficulty walking;Generalized weakness   PT Problem List Decreased strength;Decreased activity tolerance;Decreased balance;Decreased mobility;Decreased knowledge of use of DME;Decreased safety awareness;Decreased knowledge of precautions  PT Treatment Interventions DME instruction;Gait training;Functional mobility training;Therapeutic activities;Therapeutic exercise;Patient/family education;Balance training   PT Goals (Current goals can be found in the Care Plan section) Acute  Rehab PT Goals Patient Stated Goal: not stated PT Goal Formulation: With patient/family Time For Goal Achievement: 04/06/14 Potential to Achieve Goals: Good    Frequency Min 3X/week   Barriers to discharge        Co-evaluation               End of Session Equipment Utilized During Treatment: Gait belt Activity Tolerance: Patient tolerated treatment well Patient left: in chair;with call bell/phone within reach;with chair alarm set;with family/visitor present Nurse Communication:  Mobility status         Time: 8206-0156 PT Time Calculation (min): 29 min   Charges:   PT Evaluation $Initial PT Evaluation Tier I: 1 Procedure PT Treatments $Gait Training: 23-37 mins   PT G Codes:          Eileen Kangas 04/16/14, 4:53 PM  [employer] PT [phone]

## 2014-03-31 DIAGNOSIS — A498 Other bacterial infections of unspecified site: Secondary | ICD-10-CM

## 2014-03-31 LAB — BASIC METABOLIC PANEL
Anion gap: 14 (ref 5–15)
BUN: 29 mg/dL — ABNORMAL HIGH (ref 6–23)
CO2: 21 mEq/L (ref 19–32)
Calcium: 8.2 mg/dL — ABNORMAL LOW (ref 8.4–10.5)
Chloride: 102 mEq/L (ref 96–112)
Creatinine, Ser: 1.8 mg/dL — ABNORMAL HIGH (ref 0.50–1.35)
GFR calc Af Amer: 37 mL/min — ABNORMAL LOW (ref >90)
GFR, EST NON AFRICAN AMERICAN: 32 mL/min — AB (ref >90)
Glucose, Bld: 99 mg/dL (ref 70–99)
POTASSIUM: 3.6 meq/L — AB (ref 3.7–5.3)
Sodium: 137 mEq/L (ref 137–147)

## 2014-03-31 LAB — URINE CULTURE
Colony Count: 85000
SPECIAL REQUESTS: NORMAL

## 2014-03-31 LAB — CBC
HCT: 28.9 % — ABNORMAL LOW (ref 39.0–52.0)
Hemoglobin: 9.5 g/dL — ABNORMAL LOW (ref 13.0–17.0)
MCH: 30.4 pg (ref 26.0–34.0)
MCHC: 32.9 g/dL (ref 30.0–36.0)
MCV: 92.6 fL (ref 78.0–100.0)
Platelets: 95 10*3/uL — ABNORMAL LOW (ref 150–400)
RBC: 3.12 MIL/uL — AB (ref 4.22–5.81)
RDW: 13.8 % (ref 11.5–15.5)
WBC: 5.1 10*3/uL (ref 4.0–10.5)

## 2014-03-31 LAB — HEPATIC FUNCTION PANEL
ALBUMIN: 2.6 g/dL — AB (ref 3.5–5.2)
ALT: 170 U/L — ABNORMAL HIGH (ref 0–53)
AST: 39 U/L — ABNORMAL HIGH (ref 0–37)
Alkaline Phosphatase: 129 U/L — ABNORMAL HIGH (ref 39–117)
Bilirubin, Direct: 0.5 mg/dL — ABNORMAL HIGH (ref 0.0–0.3)
Indirect Bilirubin: 0.5 mg/dL (ref 0.3–0.9)
TOTAL PROTEIN: 5.2 g/dL — AB (ref 6.0–8.3)
Total Bilirubin: 1 mg/dL (ref 0.3–1.2)

## 2014-03-31 LAB — GLUCOSE, CAPILLARY
GLUCOSE-CAPILLARY: 87 mg/dL (ref 70–99)
GLUCOSE-CAPILLARY: 112 mg/dL — AB (ref 70–99)
GLUCOSE-CAPILLARY: 159 mg/dL — AB (ref 70–99)
Glucose-Capillary: 107 mg/dL — ABNORMAL HIGH (ref 70–99)

## 2014-03-31 NOTE — Progress Notes (Signed)
Ref: SPARKS,JEFFREY D, MD   Subjective:  Confused last night per nurse. Afebrile. No chest pain.  Objective:  Vital Signs in the last 24 hours: Temp:  [97.5 F (36.4 C)-98.6 F (37 C)] 98.6 F (37 C) (11/07 0733) Pulse Rate:  [29-132] 75 (11/07 0733) Cardiac Rhythm:  [-] Normal sinus rhythm (11/07 0733) Resp:  [12-29] 19 (11/07 0733) BP: (82-134)/(30-79) 107/79 mmHg (11/07 0733) SpO2:  [89 %-100 %] 98 % (11/07 0733) Weight:  [67.4 kg (148 lb 9.4 oz)] 67.4 kg (148 lb 9.4 oz) (11/07 0400)  Physical Exam: BP Readings from Last 1 Encounters:  03/31/14 107/79     Wt Readings from Last 1 Encounters:  03/31/14 67.4 kg (148 lb 9.4 oz)    Weight change: 1.084 kg (2 lb 6.2 oz)  HEENT: [place]/AT, Eyes- PERL, EOMI, Conjunctiva-Pale, Sclera-Non-icteric Neck: No JVD, No bruit, Trachea midline. Lungs:  Clear, Bilateral. Cardiac:  Regular rhythm, normal S1 and S2, no S3.  Abdomen:  Soft, non-tender. Extremities:  No edema present. No cyanosis. No clubbing. CNS: AxOx2, Cranial nerves grossly intact, moves all 4 extremities. Right handed. Skin: Warm and dry.   Intake/Output from previous day: 11/06 0701 - 11/07 0700 In: 1220.8 [P.O.:480; I.V.:590.8; IV Piggyback:150] Out: 675 [Urine:675]    Lab Results: BMET    Component Value Date/Time   NA 137 03/31/2014 0304   NA 137 03/30/2014 0225   NA 133* 03/29/2014 0002   K 3.6* 03/31/2014 0304   K 4.1 03/30/2014 0225   K 3.8 03/29/2014 0002   CL 102 03/31/2014 0304   CL 99 03/30/2014 0225   CL 98 03/29/2014 0002   CO2 21 03/31/2014 0304   CO2 22 03/30/2014 0225   CO2 20 03/29/2014 0002   GLUCOSE 99 03/31/2014 0304   GLUCOSE 96 03/30/2014 0225   GLUCOSE 206* 03/29/2014 0002   BUN 29* 03/31/2014 0304   BUN 33* 03/30/2014 0225   BUN 25* 03/29/2014 0002   CREATININE 1.80* 03/31/2014 0304   CREATININE 1.73* 03/30/2014 0225   CREATININE 1.13 03/29/2014 0002   CALCIUM 8.2* 03/31/2014 0304   CALCIUM 8.1* 03/30/2014 0225   CALCIUM 8.1*  03/29/2014 0002   GFRNONAA 32* 03/31/2014 0304   GFRNONAA 33* 03/30/2014 0225   GFRNONAA 56* 03/29/2014 0002   GFRAA 37* 03/31/2014 0304   GFRAA 39* 03/30/2014 0225   GFRAA 65* 03/29/2014 0002   CBC    Component Value Date/Time   WBC 5.1 03/31/2014 0304   RBC 3.12* 03/31/2014 0304   HGB 9.5* 03/31/2014 0304   HCT 28.9* 03/31/2014 0304   PLT 95* 03/31/2014 0304   MCV 92.6 03/31/2014 0304   MCH 30.4 03/31/2014 0304   MCHC 32.9 03/31/2014 0304   RDW 13.8 03/31/2014 0304   LYMPHSABS 0.5* 10/28/2011 1043   MONOABS 0.7 10/28/2011 1043   EOSABS 0.0 10/28/2011 1043   BASOSABS 0.0 10/28/2011 1043   HEPATIC Function Panel No results for input(s): PROT in the last 8760 hours.  Invalid input(s):  ALBUMIN,  AST,  ALT,  ALKPHOS,  BILIDIR,  IBILI HEMOGLOBIN A1C No components found for: HGA1C,  MPG CARDIAC ENZYMES Lab Results  Component Value Date   TROPONINI 2.05* 03/30/2014   TROPONINI 2.90* 03/29/2014   TROPONINI 3.45* 03/29/2014   BNP No results for input(s): PROBNP in the last 8760 hours. TSH No results for input(s): TSH in the last 8760 hours. CHOLESTEROL No results for input(s): CHOL in the last 8760 hours.  Scheduled Meds: . antiseptic oral rinse  7 mL Mouth Rinse BID  . aspirin  81 mg Oral Daily  . atorvastatin  40 mg Oral q1800  . ferrous sulfate  325 mg Oral TID WC  . insulin aspart  0-9 Units Subcutaneous TID WC  . metoprolol tartrate  12.5 mg Oral BID  . piperacillin-tazobactam (ZOSYN)  IV  3.375 g Intravenous 3 times per day  . sertraline  50 mg Oral Daily  . tamsulosin  0.4 mg Oral Daily  . ticagrelor  90 mg Oral BID   Continuous Infusions: . sodium chloride 50 mL/hr at 03/31/14 0800   PRN Meds:.acetaminophen, bisacodyl, LORazepam, nitroGLYCERIN, ondansetron (ZOFRAN) IV  Assessment/Plan: Acute anteroseptal wall MI  Status post PCI to proximal and mid junction of the LAD  Coronary artery disease history of MI in the remote past Dementia Gram-negative  bacteremia questionable source Acute on chronic kidney injury secondary to CIN/hypotension/ACE inhibitor/antibiotics Acute on chronic anemia secondary to hydration rule out GI loss Benign hypertrophy of prostate Osteoarthritis Tremors questionable history of Parkinson's disease Diabetes mellitus, II, diet controlled. Bronchitis Pancytopenia  Continue medical treatment.    LOS: 3 days    Dixie Dials  MD  03/31/2014, 11:18 AM

## 2014-03-31 NOTE — Progress Notes (Signed)
Patient ID: [name] Craft Sr., male   DOB: August 26, 1923, 80 y.o.   MRN: [id number]         [place] [facility]    Date of Admission:  03/28/2014           Day 4 piperacillin tazobactam  Active Problems:   Acute coronary syndrome   . antiseptic oral rinse  7 mL Mouth Rinse BID  . aspirin  81 mg Oral Daily  . atorvastatin  40 mg Oral q1800  . ferrous sulfate  325 mg Oral TID WC  . insulin aspart  0-9 Units Subcutaneous TID WC  . metoprolol tartrate  12.5 mg Oral BID  . piperacillin-tazobactam (ZOSYN)  IV  3.375 g Intravenous 3 times per day  . sertraline  50 mg Oral Daily  . tamsulosin  0.4 mg Oral Daily  . ticagrelor  90 mg Oral BID    Subjective: He is feeling better. He denies any abdominal pain.  OBJECTIVE: Blood pressure 107/79, pulse 75, temperature 98.6 F (37 C), temperature source Oral, resp. rate 19, height 5\' 11"  (1.803 m), weight 148 lb 9.4 oz (67.4 kg), SpO2 98 %. General: he is alert and in no distress. He is confused Skin: no rash Lungs: clear Cor: regular S1 and S2 with no murmurs Abdomen: soft and nontender  Lab Results Lab Results  Component Value Date   WBC 5.1 03/31/2014   HGB 9.5* 03/31/2014   HCT 28.9* 03/31/2014   MCV 92.6 03/31/2014   PLT 95* 03/31/2014    Lab Results  Component Value Date   CREATININE 1.80* 03/31/2014   BUN 29* 03/31/2014   NA 137 03/31/2014   K 3.6* 03/31/2014   CL 102 03/31/2014   CO2 21 03/31/2014    Lab Results  Component Value Date   ALT 188* 10/28/2011   AST 62* 10/28/2011   ALKPHOS 148* 10/28/2011   BILITOT SUPPR 10/28/2011     Microbiology: Recent Results (from the past 240 hour(s))  Blood culture (routine x 2)     Status: None (Preliminary result)   Collection Time: 03/28/14  7:30 PM  Result Value Ref Range Status   Specimen Description BLOOD FOREARM RIGHT  Final   Special Requests BOTTLES DRAWN AEROBIC AND ANAEROBIC 5CC  Final   Culture  Setup Time   Final    03/29/2014  03:47 Performed at [facility]    Culture   Final    [place] Note: Gram Stain Report Called to,Read Back By and Verified With: CHARIS YIMBU 03/29/14 1435 BY SMITHERSJ Performed at [facility]    Report Status PENDING  Incomplete  MRSA PCR Screening     Status: None   Collection Time: 03/28/14 11:02 PM  Result Value Ref Range Status   MRSA by PCR NEGATIVE NEGATIVE Final    Comment:        The GeneXpert MRSA Assay (FDA approved for NASAL specimens only), is one component of a comprehensive MRSA colonization surveillance program. It is not intended to diagnose MRSA infection nor to guide or monitor treatment for MRSA infections.   Blood culture (routine x 2)     Status: None (Preliminary result)   Collection Time: 03/28/14 11:25 PM  Result Value Ref Range Status   Specimen Description BLOOD LEFT UPPER WRIST  Final   Special Requests BOTTLES DRAWN AEROBIC ONLY 10CC  Final   Culture  Setup Time   Final    03/29/2014 03:47 Performed at  [facility]    Culture   Final    GRAM NEGATIVE RODS ESCHERICHIA COLI Note: Gram Stain Report Called to,Read Back By and Verified With: CHARIS YIMBU 03/29/14 1435 BY SMITHERSJ Performed at [facility]    Report Status PENDING  Incomplete  Culture, blood (routine x 2)     Status: None (Preliminary result)   Collection Time: 03/29/14 12:03 AM  Result Value Ref Range Status   Specimen Description BLOOD RIGHT ARM  Final   Special Requests BOTTLES DRAWN AEROBIC AND ANAEROBIC 8CC  Final   Culture  Setup Time   Final    03/29/2014 03:47 Performed at [place]    Culture   Final    [place] Note: Gram Stain Report Called to,Read Back By and Verified With: CHARIS YIMBU 03/29/14 1435 BY SMITHERSJ Performed at [facility]    Report Status PENDING  Incomplete  Culture, Urine     Status: None   Collection Time: 03/29/14  3:00 AM  Result  Value Ref Range Status   Specimen Description URINE, RANDOM  Final   Special Requests Normal  Final   Culture  Setup Time   Final    03/29/2014 11:02 Performed at [place]   Final    85,000 COLONIES/ML Performed at [facility]    Culture   Final    Multiple bacterial morphotypes present, none predominant. Suggest appropriate recollection if clinically indicated. Performed at [facility]    Report Status 03/31/2014 FINAL  Final   ULTRASOUND ABDOMEN COMPLETE 03/30/2014  COMPARISON: Renal ultrasound 04/18/2003, CT abdomen/ pelvis 01/14/2009  FINDINGS: Gallbladder: Surgically absent   IMPRESSION: Stable degree of the common duct dilatation postcholecystectomy but with possible 8 mm common duct stone or debris. This could be further evaluated at ERCP or MRCP.  Bilateral echogenic kidneys with renal cortical thinning likely indicating medical renal disease with bilateral cysts reidentified.  Mild aneurysmal aortic dilatation at its mid portion measuring 3.8 cm.   By: Conchita Paris M.D.  On: 03/30/2014 21:38  Assessment: Admission blood cultures are growing Escherichia coli and what appears to be a second gram-negative rod. His abdominal ultrasound suggests a possible common bile duct stone but he has no tenderness to suggest acute right upper quadrant infection. He appears to be improving and he has defervesced.  Plan: 1. Continue piperacillin tazobactam pending final blood culture results 2. Check liver enzymes  Michel Bickers, MD [facility] [place] [phone] pager   [phone] cell 03/31/2014, 12:14 PM

## 2014-03-31 NOTE — Progress Notes (Signed)
[place] Progress Note Patient Name: [name] Hehl Sr. DOB: 09/20/1923 MRN: [id number]   Date of Service  03/31/2014  HPI/Events of Note   Per protocol reviewed chart in setting of positive microbiology (E. Coli bacteremia). On Zosyn and being followed by ID.   eICU Interventions   No modification.       Intervention Category Minor Interventions: Routine modifications to care plan (e.g. PRN medications for pain, fever)  Jalia Zuniga R. 03/31/2014, 5:12 PM

## 2014-04-01 LAB — GLUCOSE, CAPILLARY
GLUCOSE-CAPILLARY: 90 mg/dL (ref 70–99)
Glucose-Capillary: 101 mg/dL — ABNORMAL HIGH (ref 70–99)
Glucose-Capillary: 138 mg/dL — ABNORMAL HIGH (ref 70–99)
Glucose-Capillary: 115 mg/dL — ABNORMAL HIGH (ref 70–99)

## 2014-04-01 NOTE — Progress Notes (Signed)
Ref: SPARKS,JEFFREY D, MD   Subjective:  Resting comfortably. Voices feeling well. T max 99.5 on antibiotic.  Objective:  Vital Signs in the last 24 hours: Temp:  [97.4 F (36.3 C)-99.5 F (37.5 C)] 99.5 F (37.5 C) (11/08 0800) Pulse Rate:  [74-87] 83 (11/08 0800) Cardiac Rhythm:  [-] Normal sinus rhythm (11/08 0800) Resp:  [16-24] 20 (11/08 0800) BP: (97-144)/(42-107) 131/107 mmHg (11/08 0800) SpO2:  [66 %-100 %] 96 % (11/08 0800) Weight:  [67.087 kg (147 lb 14.4 oz)] 67.087 kg (147 lb 14.4 oz) (11/08 0400)  Physical Exam: BP Readings from Last 1 Encounters:  04/01/14 131/107    Wt Readings from Last 1 Encounters:  04/01/14 67.087 kg (147 lb 14.4 oz)    Weight change: -0.313 kg (-11 oz)  HEENT: [place]/AT, Eyes-PERL, EOMI, Conjunctiva-Pale, Sclera-Non-icteric Neck: No JVD, No bruit, Trachea midline. Lungs:  Clear, Bilateral. Cardiac:  Regular rhythm, normal S1 and S2, no S3.  Abdomen:  Soft, non-tender. Extremities:  No edema present. No cyanosis. No clubbing. CNS: AxOx2, Cranial nerves grossly intact, moves all 4 extremities. Right handed. Skin: Warm and dry.   Intake/Output from previous day: 11/07 0701 - 11/08 0700 In: 1240 [P.O.:890; I.V.:300; IV Piggyback:50] Out: 150 [Urine:150]    Lab Results: BMET    Component Value Date/Time   NA 137 03/31/2014 0304   NA 137 03/30/2014 0225   NA 133* 03/29/2014 0002   K 3.6* 03/31/2014 0304   K 4.1 03/30/2014 0225   K 3.8 03/29/2014 0002   CL 102 03/31/2014 0304   CL 99 03/30/2014 0225   CL 98 03/29/2014 0002   CO2 21 03/31/2014 0304   CO2 22 03/30/2014 0225   CO2 20 03/29/2014 0002   GLUCOSE 99 03/31/2014 0304   GLUCOSE 96 03/30/2014 0225   GLUCOSE 206* 03/29/2014 0002   BUN 29* 03/31/2014 0304   BUN 33* 03/30/2014 0225   BUN 25* 03/29/2014 0002   CREATININE 1.80* 03/31/2014 0304   CREATININE 1.73* 03/30/2014 0225   CREATININE 1.13 03/29/2014 0002   CALCIUM 8.2* 03/31/2014 0304   CALCIUM 8.1* 03/30/2014 0225    CALCIUM 8.1* 03/29/2014 0002   GFRNONAA 32* 03/31/2014 0304   GFRNONAA 33* 03/30/2014 0225   GFRNONAA 56* 03/29/2014 0002   GFRAA 37* 03/31/2014 0304   GFRAA 39* 03/30/2014 0225   GFRAA 65* 03/29/2014 0002   CBC    Component Value Date/Time   WBC 5.1 03/31/2014 0304   RBC 3.12* 03/31/2014 0304   HGB 9.5* 03/31/2014 0304   HCT 28.9* 03/31/2014 0304   PLT 95* 03/31/2014 0304   MCV 92.6 03/31/2014 0304   MCH 30.4 03/31/2014 0304   MCHC 32.9 03/31/2014 0304   RDW 13.8 03/31/2014 0304   LYMPHSABS 0.5* 10/28/2011 1043   MONOABS 0.7 10/28/2011 1043   EOSABS 0.0 10/28/2011 1043   BASOSABS 0.0 10/28/2011 1043   HEPATIC Function Panel  Recent Labs  03/31/14 1145  PROT 5.2*   HEMOGLOBIN A1C No components found for: HGA1C,  MPG CARDIAC ENZYMES Lab Results  Component Value Date   TROPONINI 2.05* 03/30/2014   TROPONINI 2.90* 03/29/2014   TROPONINI 3.45* 03/29/2014   BNP No results for input(s): PROBNP in the last 8760 hours. TSH No results for input(s): TSH in the last 8760 hours. CHOLESTEROL No results for input(s): CHOL in the last 8760 hours.  Scheduled Meds: . antiseptic oral rinse  7 mL Mouth Rinse BID  . aspirin  81 mg Oral Daily  . atorvastatin  40 mg Oral q1800  . ferrous sulfate  325 mg Oral TID WC  . insulin aspart  0-9 Units Subcutaneous TID WC  . metoprolol tartrate  12.5 mg Oral BID  . piperacillin-tazobactam (ZOSYN)  IV  3.375 g Intravenous 3 times per day  . sertraline  50 mg Oral Daily  . tamsulosin  0.4 mg Oral Daily  . ticagrelor  90 mg Oral BID   Continuous Infusions: . sodium chloride 50 mL/hr at 03/31/14 0800   PRN Meds:.acetaminophen, bisacodyl, LORazepam, nitroGLYCERIN, ondansetron (ZOFRAN) IV  Assessment/Plan: Acute anteroseptal wall MI  Status post PCI to proximal and mid junction of the LAD  Coronary artery disease history of MI in the remote past Dementia Gram-negative bacteremia questionable source Acute on chronic kidney  injury secondary to CIN/hypotension/ACE inhibitor/antibiotics Acute on chronic anemia secondary to hydration rule out GI loss Benign hypertrophy of prostate Osteoarthritis Tremors questionable history of Parkinson's disease Diabetes mellitus, II, diet controlled. Pancytopenia  Continue medical treatment.     LOS: 4 days    Dixie Dials  MD  04/01/2014, 10:26 AM

## 2014-04-01 NOTE — Clinical Social Work Psychosocial (Signed)
Clinical Social Work Department BRIEF PSYCHOSOCIAL ASSESSMENT 04/01/2014  Patient:  [name] [name]     Account Number:  [id number]     Admit date:  03/28/2014  Clinical Social Worker:  Hubert Azure  Date/Time:  04/01/2014 04:04 PM  Referred by:  Physician  Date Referred:  04/01/2014 Referred for  SNF Placement   Other Referral:   Interview type:  Family Other interview type:    PSYCHOSOCIAL DATA Living Status:  WITH ADULT CHILDREN Admitted from facility:   Level of care:   Primary support name:  [name] [name] (son) POA Primary support relationship to patient:  CHILD, ADULT Degree of support available:   Good    CURRENT CONCERNS Current Concerns  Post-Acute Placement   Other Concerns:    SOCIAL WORK ASSESSMENT / PLAN CSW met with patient's daughter-in-law [name] [name]). CSW introduced self and explained role and SNF placement process. CSW further discussed d/c plan with [name] [name]. Per [name] [name], patient resides with her and his son [name] [name]) who is POA. [name] [name] states patient did not require any assistive device to ambulate. Per [name] [name], she and [name] [name] are agreeable to SNF placement for patient and prefers [facility].   Assessment/plan status:  Other - See comment Other assessment/ plan:   CSW to submit PASARR and complete FL2 for SNF placement.   Information/referral to community resources:    PATIENT'S/FAMILY'S RESPONSE TO PLAN OF CARE: Patient's daughter in law was cooperative and thanked CSW for assistance with d/c plan.    [place], [place] Weekend Clinical Social Worker [phone]

## 2014-04-01 NOTE — Progress Notes (Signed)
Patient ID: [name] Craft Sr., male   DOB: [birth year], 80 y.o.   MRN: [id number] Patient ID: [name] Kovalenko Sr., male   DOB: [birth year], 80 y.o.   MRN: [id number]         [place] for Infectious Disease    Date of Admission:  03/28/2014           Day 4 piperacillin tazobactam  Active Problems:   Acute coronary syndrome   . antiseptic oral rinse  7 mL Mouth Rinse BID  . aspirin  81 mg Oral Daily  . atorvastatin  40 mg Oral q1800  . ferrous sulfate  325 mg Oral TID WC  . insulin aspart  0-9 Units Subcutaneous TID WC  . metoprolol tartrate  12.5 mg Oral BID  . piperacillin-tazobactam (ZOSYN)  IV  3.375 g Intravenous 3 times per day  . sertraline  50 mg Oral Daily  . tamsulosin  0.4 mg Oral Daily  . ticagrelor  90 mg Oral BID    Subjective: He is feeling better. He denies any abdominal pain.  OBJECTIVE: Blood pressure 144/77, pulse 80, temperature 98.5 F (36.9 C), temperature source Axillary, resp. rate 19, height 5\' 11"  (1.803 m), weight 147 lb 14.4 oz (67.087 kg), SpO2 94 %. General: he is alert and in no distress. He is confused Skin: no rash Lungs: clear Cor: regular S1 and S2 with no murmurs Abdomen: soft and nontender  Lab Results Lab Results  Component Value Date   WBC 5.1 03/31/2014   HGB 9.5* 03/31/2014   HCT 28.9* 03/31/2014   MCV 92.6 03/31/2014   PLT 95* 03/31/2014    Lab Results  Component Value Date   CREATININE 1.80* 03/31/2014   BUN 29* 03/31/2014   NA 137 03/31/2014   K 3.6* 03/31/2014   CL 102 03/31/2014   CO2 21 03/31/2014    Lab Results  Component Value Date   ALT 170* 03/31/2014   AST 39* 03/31/2014   ALKPHOS 129* 03/31/2014   BILITOT 1.0 03/31/2014     Microbiology: Recent Results (from the past 240 hour(s))  Blood culture (routine x 2)     Status: None (Preliminary result)   Collection Time: 03/28/14  7:30 PM  Result Value Ref Range Status   Specimen Description BLOOD FOREARM RIGHT  Final   Special Requests BOTTLES DRAWN  AEROBIC AND ANAEROBIC 5CC  Final   Culture  Setup Time   Final    03/29/2014 03:47 Performed at [facility]    Culture   Final    GRAM NEGATIVE RODS ESCHERICHIA COLI Note: Gram Stain Report Called to,Read Back By and Verified With: CHARIS YIMBU 03/29/14 1435 BY SMITHERSJ Performed at [facility]    Report Status PENDING  Incomplete  MRSA PCR Screening     Status: None   Collection Time: 03/28/14 11:02 PM  Result Value Ref Range Status   MRSA by PCR NEGATIVE NEGATIVE Final    Comment:        The GeneXpert MRSA Assay (FDA approved for NASAL specimens only), is one component of a comprehensive MRSA colonization surveillance program. It is not intended to diagnose MRSA infection nor to guide or monitor treatment for MRSA infections.   Blood culture (routine x 2)     Status: None (Preliminary result)   Collection Time: 03/28/14 11:25 PM  Result Value Ref Range Status   Specimen Description BLOOD LEFT UPPER WRIST  Final   Special Requests BOTTLES DRAWN AEROBIC ONLY 10CC  Final  Culture  Setup Time   Final    03/29/2014 03:47 Performed at [place] Note: Gram Stain Report Called to,Read Back By and Verified With: CHARIS YIMBU 03/29/14 1435 BY SMITHERSJ Performed at [facility]    Report Status PENDING  Incomplete  Culture, blood (routine x 2)     Status: None (Preliminary result)   Collection Time: 03/29/14 12:03 AM  Result Value Ref Range Status   Specimen Description BLOOD RIGHT ARM  Final   Special Requests BOTTLES DRAWN AEROBIC AND ANAEROBIC 8CC  Final   Culture  Setup Time   Final    03/29/2014 03:47 Performed at [place]    Culture   Final    [place] Note: Gram Stain Report Called to,Read Back By and Verified With: CHARIS YIMBU 03/29/14 1435 BY SMITHERSJ Performed at [facility]    Report Status PENDING  Incomplete    Culture, Urine     Status: None   Collection Time: 03/29/14  3:00 AM  Result Value Ref Range Status   Specimen Description URINE, RANDOM  Final   Special Requests Normal  Final   Culture  Setup Time   Final    03/29/2014 11:02 Performed at [place]   Final    85,000 COLONIES/ML Performed at [facility]    Culture   Final    Multiple bacterial morphotypes present, none predominant. Suggest appropriate recollection if clinically indicated. Performed at [facility]    Report Status 03/31/2014 FINAL  Final   ULTRASOUND ABDOMEN COMPLETE 03/30/2014  COMPARISON: Renal ultrasound 04/18/2003, CT abdomen/ pelvis 01/14/2009  FINDINGS: Gallbladder: Surgically absent   IMPRESSION: Stable degree of the common duct dilatation postcholecystectomy but with possible 8 mm common duct stone or debris. This could be further evaluated at ERCP or MRCP.  Bilateral echogenic kidneys with renal cortical thinning likely indicating medical renal disease with bilateral cysts reidentified.  Mild aneurysmal aortic dilatation at its mid portion measuring 3.8 cm.   By: Conchita Paris M.D.  On: 03/30/2014 21:38  Assessment: Admission blood cultures are growing Escherichia coli and a second gram-negative rod. He is improving.  Plan: 1. Continue piperacillin tazobactam pending final blood culture results  Michel Bickers, MD [facility] for [place] [facility] [phone] pager   [phone] cell 04/01/2014, 12:55 PM

## 2014-04-02 ENCOUNTER — Inpatient Hospital Stay (HOSPITAL_COMMUNITY): Payer: Medicare PPO

## 2014-04-02 DIAGNOSIS — R7881 Bacteremia: Secondary | ICD-10-CM | POA: Insufficient documentation

## 2014-04-02 DIAGNOSIS — R6521 Severe sepsis with septic shock: Secondary | ICD-10-CM

## 2014-04-02 DIAGNOSIS — K8020 Calculus of gallbladder without cholecystitis without obstruction: Secondary | ICD-10-CM | POA: Insufficient documentation

## 2014-04-02 DIAGNOSIS — A4159 Other Gram-negative sepsis: Secondary | ICD-10-CM | POA: Insufficient documentation

## 2014-04-02 DIAGNOSIS — I214 Non-ST elevation (NSTEMI) myocardial infarction: Secondary | ICD-10-CM

## 2014-04-02 DIAGNOSIS — B9620 Unspecified Escherichia coli [E. coli] as the cause of diseases classified elsewhere: Secondary | ICD-10-CM

## 2014-04-02 DIAGNOSIS — B961 Klebsiella pneumoniae [K. pneumoniae] as the cause of diseases classified elsewhere: Secondary | ICD-10-CM

## 2014-04-02 DIAGNOSIS — A499 Bacterial infection, unspecified: Secondary | ICD-10-CM | POA: Insufficient documentation

## 2014-04-02 DIAGNOSIS — A414 Sepsis due to anaerobes: Secondary | ICD-10-CM | POA: Insufficient documentation

## 2014-04-02 LAB — CULTURE, BLOOD (ROUTINE X 2)

## 2014-04-02 LAB — CBC
HEMATOCRIT: 32.7 % — AB (ref 39.0–52.0)
HEMOGLOBIN: 10.7 g/dL — AB (ref 13.0–17.0)
MCH: 31.2 pg (ref 26.0–34.0)
MCHC: 32.7 g/dL (ref 30.0–36.0)
MCV: 95.3 fL (ref 78.0–100.0)
Platelets: 162 10*3/uL (ref 150–400)
RBC: 3.43 MIL/uL — ABNORMAL LOW (ref 4.22–5.81)
RDW: 13.3 % (ref 11.5–15.5)
WBC: 5.8 10*3/uL (ref 4.0–10.5)

## 2014-04-02 LAB — BASIC METABOLIC PANEL
Anion gap: 13 (ref 5–15)
BUN: 18 mg/dL (ref 6–23)
CO2: 24 mEq/L (ref 19–32)
Calcium: 8.5 mg/dL (ref 8.4–10.5)
Chloride: 106 mEq/L (ref 96–112)
Creatinine, Ser: 1.41 mg/dL — ABNORMAL HIGH (ref 0.50–1.35)
GFR calc Af Amer: 50 mL/min — ABNORMAL LOW (ref >90)
GFR calc non Af Amer: 43 mL/min — ABNORMAL LOW (ref >90)
Glucose, Bld: 122 mg/dL — ABNORMAL HIGH (ref 70–99)
POTASSIUM: 3.5 meq/L — AB (ref 3.7–5.3)
Sodium: 143 mEq/L (ref 137–147)

## 2014-04-02 LAB — GLUCOSE, CAPILLARY
GLUCOSE-CAPILLARY: 90 mg/dL (ref 70–99)
GLUCOSE-CAPILLARY: 89 mg/dL (ref 70–99)
GLUCOSE-CAPILLARY: 155 mg/dL — AB (ref 70–99)
Glucose-Capillary: 104 mg/dL — ABNORMAL HIGH (ref 70–99)

## 2014-04-02 MED ORDER — HYDROCORTISONE 1 % EX CREA
TOPICAL_CREAM | Freq: Two times a day (BID) | CUTANEOUS | Status: DC
  Administered 2014-04-02 (×2): via TOPICAL
  Administered 2014-04-03: 1 via TOPICAL
  Filled 2014-04-02: qty 28

## 2014-04-02 MED ORDER — CEFTRIAXONE SODIUM 2 G IJ SOLR
2.0000 g | INTRAMUSCULAR | Status: DC
  Administered 2014-04-02: 2 g via INTRAVENOUS
  Filled 2014-04-02 (×2): qty 2

## 2014-04-02 NOTE — Progress Notes (Signed)
[facility]    Day # 6 zosyn   Subjective: No new complaints   Antibiotics:  Anti-infectives    Start     Dose/Rate Route Frequency Ordered Stop   04/02/14 1100  cefTRIAXone (ROCEPHIN) 2 g in dextrose 5 % 50 mL IVPB     2 g100 mL/hr over 30 Minutes Intravenous Every 24 hours 04/02/14 1036     03/30/14 2300  vancomycin (VANCOCIN) IVPB 750 mg/150 ml premix  Status:  Discontinued     750 mg150 mL/hr over 60 Minutes Intravenous Every 24 hours 03/30/14 1001 03/30/14 1454   03/28/14 2300  vancomycin (VANCOCIN) IVPB 1000 mg/200 mL premix  Status:  Discontinued     1,000 mg200 mL/hr over 60 Minutes Intravenous Every 24 hours 03/28/14 2242 03/30/14 1001   03/28/14 2300  piperacillin-tazobactam (ZOSYN) IVPB 3.375 g  Status:  Discontinued     3.375 g12.5 mL/hr over 240 Minutes Intravenous 3 times per day 03/28/14 2242 04/02/14 1036      Medications: Scheduled Meds: . antiseptic oral rinse  7 mL Mouth Rinse BID  . aspirin  81 mg Oral Daily  . atorvastatin  40 mg Oral q1800  . cefTRIAXone (ROCEPHIN)  IV  2 g Intravenous Q24H  . ferrous sulfate  325 mg Oral TID WC  . hydrocortisone cream   Topical BID  . insulin aspart  0-9 Units Subcutaneous TID WC  . metoprolol tartrate  12.5 mg Oral BID  . sertraline  50 mg Oral Daily  . tamsulosin  0.4 mg Oral Daily  . ticagrelor  90 mg Oral BID   Continuous Infusions: . sodium chloride 50 mL/hr at 04/02/14 0406   PRN Meds:.acetaminophen, bisacodyl, LORazepam, nitroGLYCERIN, ondansetron (ZOFRAN) IV    Objective: Weight change:   Intake/Output Summary (Last 24 hours) at 04/02/14 1243 Last data filed at 04/01/14 1300  Gross per 24 hour  Intake    120 ml  Output      0 ml  Net    120 ml   Blood pressure 125/76, pulse 71, temperature 98 F (36.7 C), temperature source Oral, resp. rate 15, height 5\' 11"  (1.803 m), weight 147 lb 14.4 oz (67.087 kg), SpO2 100 %. Temp:  [98 F (36.7 C)-99.1 F (37.3 C)] 98 F  (36.7 C) (11/09 1153) Pulse Rate:  [68-90] 71 (11/09 1153) Resp:  [12-19] 15 (11/09 1153) BP: (116-161)/(62-103) 125/76 mmHg (11/09 1153) SpO2:  [95 %-100 %] 100 % (11/09 1153)  Physical Exam: General: Alert and awake, oriented x3, frail, HOH HEENT: anicteric sclera, pupils reactive to light and accommodation, EOMI CVS regular rate, normal r,  no murmur rubs or gallops Chest: clear to auscultation bilaterally, no wheezing, rales or rhonchi, voluntary guarding of abdomen Abdomen: soft nontender, nondistended, normal bowel sounds, Extremities: no  clubbing or edema noted bilaterally Skin: ecchymoses  Neuro: nonfocal  CBC:  Recent Labs Lab 03/28/14 1923 03/29/14 0002 03/30/14 0225 03/31/14 0304 04/02/14 0920  HGB 12.0* 10.3* 9.5* 9.5* 10.7*  HCT 36.9* 31.2* 28.1* 28.9* 32.7*  PLT 74* 77* 92* 95* 162  INR 1.10  --   --   --   --      BMET  Recent Labs  03/31/14 0304 04/02/14 0920  NA 137 143  K 3.6* 3.5*  CL 102 106  CO2 21 24  GLUCOSE 99 122*  BUN 29* 18  CREATININE 1.80* 1.41*  CALCIUM 8.2* 8.5     Liver Panel   Recent  Labs  03/31/14 1145  PROT 5.2*  ALBUMIN 2.6*  AST 39*  ALT 170*  ALKPHOS 129*  BILITOT 1.0  BILIDIR 0.5*  IBILI 0.5       Sedimentation Rate No results for input(s): ESRSEDRATE in the last 72 hours. C-Reactive Protein No results for input(s): CRP in the last 72 hours.  Micro Results: Recent Results (from the past 720 hour(s))  Blood culture (routine x 2)     Status: None   Collection Time: 03/28/14  7:30 PM  Result Value Ref Range Status   Specimen Description BLOOD FOREARM RIGHT  Final   Special Requests BOTTLES DRAWN AEROBIC AND ANAEROBIC 5CC  Final   Culture  Setup Time   Final    03/29/2014 03:47 Performed at [place] Note: Gram Stain Report Called to,Read Back By and Verified With: CHARIS YIMBU 03/29/14 1435 BY SMITHERSJ Performed at  [facility]    Report Status 04/02/2014 FINAL  Final   Organism ID, Bacteria KLEBSIELLA PNEUMONIAE  Final   Organism ID, Bacteria ESCHERICHIA COLI  Final      Susceptibility   Escherichia coli - MIC*    AMPICILLIN 8 SENSITIVE Sensitive     AMPICILLIN/SULBACTAM 4 SENSITIVE Sensitive     CEFAZOLIN <=4 SENSITIVE Sensitive     CEFEPIME <=1 SENSITIVE Sensitive     CEFTAZIDIME <=1 SENSITIVE Sensitive     CEFTRIAXONE <=1 SENSITIVE Sensitive     CIPROFLOXACIN <=0.25 SENSITIVE Sensitive     GENTAMICIN <=1 SENSITIVE Sensitive     IMIPENEM <=0.25 SENSITIVE Sensitive     PIP/TAZO <=4 SENSITIVE Sensitive     TOBRAMYCIN <=1 SENSITIVE Sensitive     TRIMETH/SULFA <=20 SENSITIVE Sensitive     * ESCHERICHIA COLI   Klebsiella pneumoniae - MIC*    AMPICILLIN RESISTANT      AMPICILLIN/SULBACTAM 4 SENSITIVE Sensitive     CEFAZOLIN <=4 SENSITIVE Sensitive     CEFEPIME <=1 SENSITIVE Sensitive     CEFTAZIDIME <=1 SENSITIVE Sensitive     CEFTRIAXONE <=1 SENSITIVE Sensitive     CIPROFLOXACIN <=0.25 SENSITIVE Sensitive     GENTAMICIN <=1 SENSITIVE Sensitive     IMIPENEM <=0.25 SENSITIVE Sensitive     PIP/TAZO <=4 SENSITIVE Sensitive     TOBRAMYCIN <=1 SENSITIVE Sensitive     TRIMETH/SULFA* <=20 SENSITIVE Sensitive      * SET UP TIME:  [id number]    * KLEBSIELLA PNEUMONIAE  MRSA PCR Screening     Status: None   Collection Time: 03/28/14 11:02 PM  Result Value Ref Range Status   MRSA by PCR NEGATIVE NEGATIVE Final    Comment:        The GeneXpert MRSA Assay (FDA approved for NASAL specimens only), is one component of a comprehensive MRSA colonization surveillance program. It is not intended to diagnose MRSA infection nor to guide or monitor treatment for MRSA infections.   Blood culture (routine x 2)     Status: None   Collection Time: 03/28/14 11:25 PM  Result Value Ref Range Status   Specimen Description BLOOD LEFT UPPER WRIST  Final   Special Requests BOTTLES DRAWN AEROBIC ONLY  10CC  Final   Culture  Setup Time   Final    03/29/2014 03:47 Performed at [facility]    Culture   Final    GRAM NEGATIVE RODS ESCHERICHIA COLI Note: Gram Stain Report Called to,Read Back By and Verified  With: CHARIS YIMBU 03/29/14 1435 BY SMITHERSJ Performed at [facility]    Report Status 04/02/2014 FINAL  Final  Culture, blood (routine x 2)     Status: None   Collection Time: 03/29/14 12:03 AM  Result Value Ref Range Status   Specimen Description BLOOD RIGHT ARM  Final   Special Requests BOTTLES DRAWN AEROBIC AND ANAEROBIC 8CC  Final   Culture  Setup Time   Final    03/29/2014 03:47 Performed at [facility]    Culture   Final    [place] Note: Gram Stain Report Called to,Read Back By and Verified With: CHARIS YIMBU 03/29/14 1435 BY SMITHERSJ Performed at [facility]    Report Status 04/02/2014 FINAL  Final  Culture, Urine     Status: None   Collection Time: 03/29/14  3:00 AM  Result Value Ref Range Status   Specimen Description URINE, RANDOM  Final   Special Requests Normal  Final   Culture  Setup Time   Final    03/29/2014 11:02 Performed at [facility]   Final    85,000 COLONIES/ML Performed at [facility]    Culture   Final    Multiple bacterial morphotypes present, none predominant. Suggest appropriate recollection if clinically indicated. Performed at [facility]    Report Status 03/31/2014 FINAL  Final  Culture, blood (routine x 2)     Status: None (Preliminary result)   Collection Time: 03/30/14  2:25 PM  Result Value Ref Range Status   Specimen Description BLOOD LEFT ARM  Final   Special Requests BOTTLES DRAWN AEROBIC ONLY 3CC  Final   Culture  Setup Time   Final    03/30/2014 20:28 Performed at [facility]    Culture   Final           BLOOD CULTURE RECEIVED NO GROWTH TO DATE CULTURE WILL BE HELD FOR 5 DAYS BEFORE ISSUING A FINAL NEGATIVE  REPORT Performed at [facility]    Report Status PENDING  Incomplete  Culture, blood (routine x 2)     Status: None (Preliminary result)   Collection Time: 03/30/14  2:30 PM  Result Value Ref Range Status   Specimen Description BLOOD RIGHT HAND  Final   Special Requests BOTTLES DRAWN AEROBIC AND ANAEROBIC 10CC  Final   Culture  Setup Time   Final    03/30/2014 20:28 Performed at [facility]    Culture   Final           BLOOD CULTURE RECEIVED NO GROWTH TO DATE CULTURE WILL BE HELD FOR 5 DAYS BEFORE ISSUING A FINAL NEGATIVE REPORT Performed at [facility]    Report Status PENDING  Incomplete    Studies/Results: No results found.    Assessment/Plan:  Active Problems:   Acute coronary syndrome    [name] Craft Sr. is a 80 y.o. male with  Polymicrobial bacteremia with septic shock NSTEMI, sp PCI and stent  #1 Polymicrobial bacteremia and septic shock from Klebsiella PNA and E coli (both organisms persistently isolated from blood on first 2 days of admission)  --narrow to Rocephin 2 g daily --CT abdomen with PO contrast to ensure no intraabdominal abscess  I dont want to switch him to PO abx given he did not clear this rapidly and I am not sure if he might not have an intraabdominal abscess as source  LOS: 5 days   Alcide Evener 04/02/2014, 12:43 PM

## 2014-04-02 NOTE — Care Management Note (Signed)
    Page 1 of 1   04/02/2014     3:24:32 PM CARE MANAGEMENT NOTE 04/02/2014  Patient:  [facility]   Account Number:  [id number]  Date Initiated:  03/30/2014  Documentation initiated by:  Luz Lex  Subjective/Objective Assessment:   Admitted with chest pain.     Action/Plan:   Anticipated DC Date:  04/03/2014   Anticipated DC Plan:  SKILLED NURSING FACILITY  In-house referral  Clinical Social Worker      DC Planning Services  CM consult      Choice offered to / List presented to:             Status of service:  Completed, signed off Medicare Important Message given?  YES (If response is "NO", the following Medicare IM given date fields will be blank) Date Medicare IM given:  04/02/2014 Medicare IM given by:  GRAVES-BIGELOW,Annebelle Bostic Date Additional Medicare IM given:   Additional Medicare IM given by:    Discharge Disposition:  [place]  Per UR Regulation:  Reviewed for med. necessity/level of care/duration of stay  If discussed at [place] of Stay Meetings, dates discussed:   04/03/2014    Comments:  ContactSENG, LARCH  [phone]  04-02-14 Zeigler, RN, BSN [phone] Pt now transitioned to IV Rocephin. Plan for CT abdomen to r/o questionable intraabdominal abbesses. Plan will be for SNF once medically stable for d/c.   03-30-14  4:10pm   Luz Lex, RNBSN - [phone] Talked with patient, daughter in law, Butch Penny and grandaughter Judson Roch.  pateint lives with son and daughter in law with him.  States he is demented but pleasant.  Has increase in falls at home.  Discussed rehab options. Agreeable to ST rehab in SNF - pending PT consult.  SW given heads up.

## 2014-04-02 NOTE — Progress Notes (Signed)
Subjective:  Appreciate infectious disease consult and help. Patient is up in chair talking doing well denies any chest pain or shortness of breath. His hemoglobin and renal function has improved. Thrombocytopenia also has been resolved. Remains afebrile schedule for CT of the abdomen to look for source of infection.  Objective:  Vital Signs in the last 24 hours: Temp:  [98 F (36.7 C)-99.1 F (37.3 C)] 98 F (36.7 C) (11/09 1153) Pulse Rate:  [68-90] 71 (11/09 1153) Resp:  [12-19] 15 (11/09 1153) BP: (116-161)/(62-103) 125/76 mmHg (11/09 1153) SpO2:  [95 %-100 %] 100 % (11/09 1153)  Intake/Output from previous day: 11/08 0701 - 11/09 0700 In: 120 [P.O.:120] Out: 200 [Urine:200] Intake/Output from this shift:    Physical Exam: Neck: no adenopathy, no carotid bruit, no JVD and supple, symmetrical, trachea midline Lungs: clear to auscultation bilaterally Heart: regular rate and rhythm, S1, S2 normal and soft systolic murmur noted no S3 gallop Abdomen: soft, non-tender; bowel sounds normal; no masses,  no organomegaly Extremities: extremities normal, atraumatic, no cyanosis or edema  Lab Results:  Recent Labs  03/31/14 0304 04/02/14 0920  WBC 5.1 5.8  HGB 9.5* 10.7*  PLT 95* 162    Recent Labs  03/31/14 0304 04/02/14 0920  NA 137 143  K 3.6* 3.5*  CL 102 106  CO2 21 24  GLUCOSE 99 122*  BUN 29* 18  CREATININE 1.80* 1.41*   No results for input(s): TROPONINI in the last 72 hours.  Invalid input(s): CK, MB Hepatic Function Panel  Recent Labs  03/31/14 1145  PROT 5.2*  ALBUMIN 2.6*  AST 39*  ALT 170*  ALKPHOS 129*  BILITOT 1.0  BILIDIR 0.5*  IBILI 0.5   No results for input(s): CHOL in the last 72 hours. No results for input(s): PROTIME in the last 72 hours.  Imaging: Imaging results have been reviewed and No results found.  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 Stable from cardiac point of view to be discharged   LOS: 5 days    [name] [name] N 04/02/2014, 12:07 PM

## 2014-04-02 NOTE — Progress Notes (Signed)
CSW (Clinical Education officer, museum) notified [facility] of family preference. CSW asked facility to please start insurance authorization as soon as possible if they are able to offer a bed.  [place], [employer]

## 2014-04-02 NOTE — Clinical Social Work Placement (Addendum)
    Clinical Social Work Department CLINICAL SOCIAL WORK PLACEMENT NOTE 04/02/2014  Patient:  [facility]  Account Number:  [id number] Admit date:  03/28/2014  Clinical Social Worker:  Adair Laundry  Date/time:  04/02/2014 09:53 AM  Clinical Social Work is seeking post-discharge placement for this patient at the following level of care:   SKILLED NURSING   (*CSW will update this form in Epic as items are completed)   04/02/2014  Patient/family provided with [place] Department of Clinical Social Work's list of facilities offering this level of care within the geographic area requested by the patient (or if unable, by the patient's family).  04/02/2014  Patient/family informed of their freedom to choose among providers that offer the needed level of care, that participate in Medicare, Medicaid or managed care program needed by the patient, have an available bed and are willing to accept the patient.  04/02/2014  Patient/family informed of [facility]' ownership interest in [facility], as well as of the fact that they are under no obligation to receive care at this facility.  PASARR submitted to EDS on 04/02/2014 PASARR number received on 04/02/2014  FL2 transmitted to all facilities in geographic area requested by pt/family on  04/02/2014 FL2 transmitted to all facilities within larger geographic area on   Patient informed that his/her managed care company has contracts with or will negotiate with  certain facilities, including the following:     Patient/family informed of bed offers received:  04/02/2014 Patient chooses bed at [facility] Physician recommends and patient chooses bed at    Patient to be transferred to [facility] on  04/04/2014 Patient to be transferred to facility by [facility] Patient and family notified of transfer on 04/04/2014 Name of family member notified:  Butch Penny (daughter-in-law)  The following physician request  were entered in Epic: Physician Request  Please sign FL2.    Additional CommentsBerton Mount, [place]

## 2014-04-02 NOTE — Progress Notes (Signed)
Pt is not appropriate for CR. Will sign off.  Yves Dill CES, ACSM 1:23 PM 04/02/2014

## 2014-04-03 DIAGNOSIS — L740 Miliaria rubra: Secondary | ICD-10-CM | POA: Insufficient documentation

## 2014-04-03 DIAGNOSIS — K8020 Calculus of gallbladder without cholecystitis without obstruction: Secondary | ICD-10-CM

## 2014-04-03 DIAGNOSIS — L270 Generalized skin eruption due to drugs and medicaments taken internally: Secondary | ICD-10-CM | POA: Insufficient documentation

## 2014-04-03 DIAGNOSIS — R21 Rash and other nonspecific skin eruption: Secondary | ICD-10-CM

## 2014-04-03 LAB — GLUCOSE, CAPILLARY
GLUCOSE-CAPILLARY: 84 mg/dL (ref 70–99)
Glucose-Capillary: 98 mg/dL (ref 70–99)
Glucose-Capillary: 100 mg/dL — ABNORMAL HIGH (ref 70–99)
Glucose-Capillary: 127 mg/dL — ABNORMAL HIGH (ref 70–99)

## 2014-04-03 MED ORDER — BISACODYL 5 MG PO TBEC: 5.0000 mg | DELAYED_RELEASE_TABLET | Freq: Every day | ORAL | Status: AC | PRN

## 2014-04-03 MED ORDER — NITROGLYCERIN 0.4 MG SL SUBL: 0.4000 mg | SUBLINGUAL_TABLET | SUBLINGUAL | Status: AC | PRN

## 2014-04-03 MED ORDER — TICAGRELOR 90 MG PO TABS: 90.0000 mg | ORAL_TABLET | Freq: Two times a day (BID) | ORAL | Status: AC

## 2014-04-03 MED ORDER — CIPROFLOXACIN HCL 500 MG PO TABS: 500.0000 mg | ORAL_TABLET | Freq: Two times a day (BID) | ORAL | Status: DC

## 2014-04-03 MED ORDER — ATORVASTATIN CALCIUM 40 MG PO TABS: 40.0000 mg | ORAL_TABLET | Freq: Every day | ORAL | Status: AC

## 2014-04-03 MED ORDER — FERROUS SULFATE 325 (65 FE) MG PO TABS: 325.0000 mg | ORAL_TABLET | Freq: Three times a day (TID) | ORAL | Status: AC

## 2014-04-03 MED ORDER — METOPROLOL TARTRATE 12.5 MG HALF TABLET: 12.5000 mg | ORAL_TABLET | Freq: Two times a day (BID) | ORAL | Status: AC

## 2014-04-03 MED ORDER — CIPROFLOXACIN HCL 500 MG PO TABS
500.0000 mg | ORAL_TABLET | Freq: Two times a day (BID) | ORAL | Status: DC
  Administered 2014-04-03 – 2014-04-04 (×3): 500 mg via ORAL
  Filled 2014-04-03 (×3): qty 1

## 2014-04-03 NOTE — Discharge Instructions (Signed)
° °  ° °Acute Coronary Syndrome °Acute coronary syndrome (ACS) is an urgent problem in which the blood and oxygen supply to the heart is critically deficient. ACS requires hospitalization because one or more coronary arteries may be blocked. °ACS represents a range of conditions including: °· Previous angina that is now unstable, lasts longer, happens at rest, or is more intense. °· A heart attack, with heart muscle cell injury and death. °There are three vital coronary arteries that supply the heart muscle with blood and oxygen so that it can pump blood effectively. If blockages to these arteries develop, blood flow to the heart muscle is reduced. If the heart does not get enough blood, angina may occur as the first warning sign. °SYMPTOMS  °· The most common signs of angina include: °· Tightness or squeezing in the chest. °· Feeling of heaviness on the chest. °· Discomfort in the arms, neck, back, or jaw. °· Shortness of breath and nausea. °· Cold, wet skin. °· Angina is usually brought on by physical effort or excitement which increase the oxygen needs of the heart. These states increase the blood flow needs of the heart beyond what can be delivered. °· Other symptoms that are not as common include: °· Fatigue °· Unexplained feelings of nervousness or anxiety °· Weakness °· Diarrhea °· Sometimes, you may not have noticed any symptoms at all but still suffered a cardiac injury. °TREATMENT  °· Medicines to help discomfort may include nitroglycerin (nitro) in the form of tablets or a spray for rapid relief, or longer-acting forms such as cream, patches, or capsules. (Be aware that there are many side effects and possible interactions with other drugs). °· Other medicines may be used to help the heart pump better. °· Procedures to open blocked arteries including angioplasty or stent placement to keep the arteries open. °· Open heart surgery may be needed when there are many blockages or they are in critical  locations that are best treated with surgery. °HOME CARE INSTRUCTIONS  °· Do not use any tobacco products including cigarettes, chewing tobacco, or electronic cigarettes. °· Take one baby or adult aspirin daily, if your health care provider advises. This helps reduce the risk of a heart attack. °· It is very important that you follow the angina treatment prescribed by your health care provider. Make arrangements for proper follow-up care. °· Eat a heart healthy diet with salt and fat restrictions as advised. °· Regular exercise is good for you as long as it does not cause discomfort. Do not begin any new type of exercise until you check with your health care provider. °· If you are overweight, you should lose weight. °· Try to maintain normal blood lipid levels. °· Keep your blood pressure under control as recommended by your health care provider. °· You should tell your health care provider right away about any increase in the severity or frequency of your chest discomfort or angina attacks. When you have angina, you should stop what you are doing and sit down. This may bring relief in 3 to 5 minutes. If your health care provider has prescribed nitro, take it as directed. °· If your health care provider has given you a follow-up appointment, it is very important to keep that appointment. Not keeping the appointment could result in a chronic or permanent injury, pain, and disability. If there is any problem keeping the appointment, you must call back to this facility for assistance. °SEEK IMMEDIATE MEDICAL CARE IF:  °· You develop   nausea, vomiting, or shortness of breath. °· You feel faint, lightheaded, or pass out. °· Your chest discomfort gets worse. °· You are sweating or experience sudden profound fatigue. °· You do not get relief of your chest pain after 3 doses of nitro. °· Your discomfort lasts longer than 15 minutes. °MAKE SURE YOU:  °· Understand these instructions. °· Will watch your condition. °· Will get  help right away if you are not doing well or get worse. °· Take all medicines as directed by your health care provider. °Document Released: 05/11/2005 Document Revised: 05/16/2013 Document Reviewed: 09/12/2013 °ExitCare® Patient Information ©2015 [employer]. This information is not intended to replace advice given to you by your health care provider. Make sure you discuss any questions you have with your health care provider. °Coronary Angiogram with Stent °Coronary angiography with stent placement is a procedure to widen or open a narrow blood vessel of the heart (coronary artery). When a coronary artery becomes partially blocked, it decreases blood flow to that area. This may lead to chest pain or a heart attack (myocardial infarction). Arteries may become blocked by cholesterol buildup (plaque) in the lining or wall.  °A stent is a small piece of metal that looks like a mesh or a spring. Stent placement may be done right after a coronary angiography in which a blocked artery is found or as a treatment for a heart attack.  °LET YOUR HEALTH CARE PROVIDER KNOW ABOUT: °· Any allergies you have.   °· All medicines you are taking, including vitamins, herbs, eye drops, creams, and over-the-counter medicines.   °· Previous problems you or members of your family have had with the use of anesthetics.   °· Any blood disorders you have.   °· Previous surgeries you have had.   °· Medical conditions you have. °RISKS AND COMPLICATIONS °Generally, coronary angiography with stent is a safe procedure. However, problems can occur and include: °· Damage to the heart or its blood vessels.   °· A return of blockage.   °· Bleeding, infection, or bruising at the insertion site.   °· A collection of blood under the skin (hematoma) at the insertion site. °· Blood clot in another part of the body.   °· Kidney injury.   °· Allergic reaction to the dye or contrast used.   °· Bleeding into the abdomen (retroperitoneal bleeding). °BEFORE  THE PROCEDURE °· Do not eat or drink anything after midnight on the night before the procedure or as directed by your health care provider.  °· Ask your health care provider about changing or stopping your regular medicines. This is especially important if you are taking diabetes medicines or blood thinners. °· Your health care provider will make sure you understand the procedure as well as the risks and potential problems associated with the procedure.   °PROCEDURE °· You may be given a medicine to help you relax before and during the procedure (sedative). This medicine will be given through an IV tube that is put into one of your veins.   °· The area where the catheter will be inserted will be shaved and cleaned. This is usually done in the groin but may be done in the fold of your arm (near your elbow) or in the wrist.    °· A medicine will be given to numb the area where the catheter will be inserted (local anesthetic).   °· The catheter will be inserted into an artery using a guide wire. A type of X-ray (fluoroscopy) will be used to help guide the catheter to the opening of the blocked   artery.   °· A dye will then be injected into the catheter, and X-rays will be taken. The dye will help to show where any narrowing or blockages are located in the heart arteries.   °· A tiny wire will be guided to the blocked spot, and a balloon will be inflated to make the artery wider. The stent will be expanded and will crush the plaque into the wall of the vessel. The stent will hold the area open like a scaffolding and improve the blood flow.   °· Sometimes the artery may be made wider using a laser or other tools to remove plaque.   °· When the blood flow is better, the catheter will be removed. The lining of the artery will grow over the stent, which stays where it was placed.   °AFTER THE PROCEDURE °· If the procedure is done through the leg, you will be kept in bed lying flat for about 6 hours. You will be instructed to  not bend or cross your legs.   °· The insertion site will be checked frequently.   °· The pulse in your feet or wrist will be checked frequently.   °· Additional blood tests, X-rays, and electrocardiography may be done. °Document Released: 11/15/2002 Document Revised: 09/25/2013 Document Reviewed: 11/17/2012 °ExitCare® Patient Information ©2015 [employer]. This information is not intended to replace advice given to you by your health care provider. Make sure you discuss any questions you have with your health care provider. ° °

## 2014-04-03 NOTE — Progress Notes (Signed)
CSW worked throughout the day to arrange d/c of patient to SNF level of care at [facility] in [place]. CSW spoke throughout the day with Neoma Laming- Admissions Director who anticipated that an [facility] number would be received this afternoon for patient as she had been working on it for several days.  CSW requested that facility accept a Letter of Guarantee but this was declined stating that if they accepted a LOG- her Mcarthur Rossetti would deny the stay.  CSW spoke multiple times with patient's son Han Vejar to update him on above. He went to the facility to sign admission paperwork this afternoon. As of 5:30- facility had not received Craig Staggers and admissions director stated that the patient's case was referred to the medical director for review.  She anticipates an auth in the morning.  CSW notified patient's nurse and son and will follow up in the morning.  Lorie Phenix. Pauline Good, [place]

## 2014-04-03 NOTE — Discharge Summary (Signed)
[facility] discharge summary dictated on 04/03/2014 dictation number is [phone]

## 2014-04-03 NOTE — Progress Notes (Signed)
Physical Therapy Treatment Patient Details Name: [name] Borman Sr. MRN: [id number] DOB: 12-Dec-1923 Today's Date: 04/03/2014    History of Present Illness Pt adm with STEMI. Underwent cath with stent. PMH - dementia, CAD, MI    PT Comments    Patient is progressing towards physical therapy goals. Tolerated gait training and therapeutic exercises well. HR 80-98 BPM during therapy session. He is motivated to work with therapy. Patient will continue to benefit from skilled physical therapy services to further improve independence with functional mobility.   Follow Up Recommendations  SNF     Equipment Recommendations  Rolling walker with 5" wheels    Recommendations for Other Services       Precautions / Restrictions Precautions Precautions: Fall Restrictions Weight Bearing Restrictions: No    Mobility  Bed Mobility Overal bed mobility: Needs Assistance Bed Mobility: Supine to Sit;Sit to Supine     Supine to sit: Min guard;HOB elevated Sit to supine: Min guard;HOB elevated   General bed mobility comments: Min guard for safety. VC for technique. HOB elevated.  Transfers Overall transfer level: Needs assistance Equipment used: Rolling walker (2 wheeled) Transfers: Sit to/from Stand Sit to Stand: Min assist         General transfer comment: Min assist for stability initially when rising from lowest bed setting. VC for technique. Stability improves once holding onto walker. Decreased control with descent. Min guard while rising from reclining chair.  Ambulation/Gait Ambulation/Gait assistance: Min guard Ambulation Distance (Feet): 100 Feet (x2) Assistive device: Rolling walker (2 wheeled) Gait Pattern/deviations: Step-through pattern;Decreased stride length;Trunk flexed   Gait velocity interpretation: Below normal speed for age/gender General Gait Details: VC for upright posture and forward gaze. Good control of rolling walker. No loss of balance noted while holding  assistive device. HR 80-98 BPM during bouts.   Stairs            Wheelchair Mobility    Modified Rankin (Stroke Patients Only)       Balance                                    Cognition Arousal/Alertness: Awake/alert Behavior During Therapy: WFL for tasks assessed/performed Overall Cognitive Status: History of cognitive impairments - at baseline                      Exercises General Exercises - Lower Extremity Ankle Circles/Pumps: AROM;Both;20 reps;Seated Long Arc Quad: Strengthening;Both;15 reps;Seated Hip Flexion/Marching: Strengthening;Both;15 reps;Seated    General Comments        Pertinent Vitals/Pain Pain Assessment: No/denies pain    Home Living                      Prior Function            PT Goals (current goals can now be found in the care plan section) Acute Rehab PT Goals PT Goal Formulation: With patient/family Progress towards PT goals: Progressing toward goals    Frequency  Min 3X/week    PT Plan Current plan remains appropriate    Co-evaluation             End of Session Equipment Utilized During Treatment: Gait belt Activity Tolerance: Patient tolerated treatment well Patient left: in chair;with call bell/phone within reach;with chair alarm set;with family/visitor present     Time: 0272-5366 (- 10 minutes, while MD spoke with patient) PT Time Calculation (min) (  ACUTE ONLY): 29 min  Charges:  $Gait Training: 8-22 mins                    G Codes:      Ellouise Newer 2014-04-04, 10:26 AM  Camille Bal [place], [place]

## 2014-04-03 NOTE — Plan of Care (Signed)
Problem: Consults Goal: MI Patient Education (See Patient Education module for education specifics.)  Outcome: Completed/Met Date Met:  04/03/14  Problem: Phase I Progression Outcomes Goal: Initial discharge plan identified Outcome: Completed/Met Date Met:  04/03/14 Goal: Other Phase I Outcomes/Goals Outcome: Completed/Met Date Met:  04/03/14  Problem: Phase II Progression Outcomes Goal: Hemodynamically stable Outcome: Completed/Met Date Met:  04/03/14 Goal: Vascular site scale level 0 - I Vascular Site Scale Level 0: No bruising/bleeding/hematoma Level I (Mild): Bruising/Ecchymosis, minimal bleeding/ooozing, palpable hematoma < 3 cm Level II (Moderate): Bleeding not affecting hemodynamic parameters, pseudoaneurysm, palpable hematoma > 3 cm Level III (Severe) Bleeding which affects hemodynamic parameters or retroperitoneal hemorrhage  Outcome: Completed/Met Date Met:  04/03/14 Goal: Cardiac Rehab referral Outcome: Completed/Met Date Met:  04/03/14 Goal: Wean IV nitroglycerin to PO or topical Outcome: Not Applicable Date Met:  41/58/30 Goal: Discharge plan established Outcome: Completed/Met Date Met:  04/03/14 Goal: Tolerating diet Outcome: Completed/Met Date Met:  04/03/14 Goal: Other Phase II Outcomes/Goals Outcome: Completed/Met Date Met:  04/03/14  Problem: Phase III Progression Outcomes Goal: Anginal pain absent Outcome: Completed/Met Date Met:  04/03/14 Goal: Up to chair & ambulate with assist (TID) Outcome: Completed/Met Date Met:  04/03/14 Goal: VS Stable with increased activity Outcome: Completed/Met Date Met:  04/03/14 Goal: Vascular site scale level 0 - I Vascular Site Scale Level 0: No bruising/bleeding/hematoma Level I (Mild): Bruising/Ecchymosis, minimal bleeding/ooozing, palpable hematoma < 3 cm Level II (Moderate): Bleeding not affecting hemodynamic parameters, pseudoaneurysm, palpable hematoma > 3 cm Level III (Severe) Bleeding which affects hemodynamic  parameters or retroperitoneal hemorrhage  Outcome: Completed/Met Date Met:  04/03/14 Goal: ACE I or ARB if EF < 40% Outcome: Completed/Met Date Met:  04/03/14 Goal: Tolerating diet Outcome: Completed/Met Date Met:  04/03/14 Goal: Discharge plan remains appropriate-arrangements made Outcome: Completed/Met Date Met:  04/03/14 Goal: Other Phase III Outcomes/Goals Outcome: Completed/Met Date Met:  04/03/14  Problem: Discharge Progression Outcomes Goal: Barriers To Progression Addressed/Resolved Outcome: Completed/Met Date Met:  04/03/14 Goal: Discharge plan in place and appropriate Outcome: Completed/Met Date Met:  04/03/14 Goal: Anginal pain absent Outcome: Completed/Met Date Met:  04/03/14 Goal: Hemodynamically stable Outcome: Completed/Met Date Met:  94/07/68 Goal: Complications resolved/controlled Outcome: Completed/Met Date Met:  04/03/14 Goal: Tolerates diet Outcome: Completed/Met Date Met:  04/03/14 Goal: Activity plan per MD/Cardiac Rehab Outcome: Completed/Met Date Met:  04/03/14 Goal: Lipid-lowering therapy prescribed Outcome: Completed/Met Date Met:  04/03/14 Goal: Other Discharge Outcomes/Goals Outcome: Completed/Met Date Met:  04/03/14

## 2014-04-03 NOTE — Discharge Summary (Signed)
[name] [name], [name] [name] NO.:  [id number]  MEDICAL RECORD NO.:  [id number]  LOCATION:  3W14C                        FACILITY:  [facility]  PHYSICIAN:  Allegra Lai. Terrence Dupont, M.D. DATE OF BIRTH:  1923-07-18  DATE OF ADMISSION:  03/28/2014 DATE OF DISCHARGE:  04/03/2014                              DISCHARGE SUMMARY   ADMITTING DIAGNOSES: 1. Probable acute anteroseptal wall myocardial infarction. 2. Coronary artery disease, history of myocardial infarction in the     remote past. 3. Dementia. 4. Benign hypertrophy of prostate. 5. Osteoarthritis. 6. Tremors, questionable history of Parkinson's disease. 7. Elevated blood sugar, rule out diabetes mellitus. 8. Hyperpyrexia, rule out sepsis.  DISCHARGE DIAGNOSES: 1. Status post acute anteroseptal wall myocardial infarction, status     post percutaneous coronary intervention to proximal and mid     junction of left anterior descending artery. 2. Coronary artery disease, history of remote myocardial infarction in     the past, with occluded right coronary artery, filling by     collaterals from left system. 3. Hypertension. 4. New-onset diabetes mellitus, controlled by diet. 5. Hypercholesteremia. 6. E. coli/Klebsiella bacteremia resolving. 7. Status post acute on chronic kidney injury secondary to contrast-     induced nephropathy/hypertension/ACE inhibitor and vancomycin. 8. Chronic anemia. 9. Benign hypertrophy of prostate. 10.Osteoarthritis. 11.Tremors, questionable history of Parkinson's disease. 12.Status post bronchitis. 13.Dementia. 14.Gallstone. 15.Status post polymicrobial bacterial infection.  DISCHARGE HOME MEDICATIONS: 1. Atorvastatin 40 mg 1 tablet daily. 2. Dulcolax 1 tablet daily as needed for constipation. 3. Ciprofloxacin 500 mg twice daily for 8 days. 4. Ferrous sulfate 325 mg 3 times daily. 5. Metoprolol tartrate 12.5 mg twice daily. 6. Nitrostat 0.4 mg sublingual use as directed. 7. Brilinta 90  mg twice daily. 8. Tylenol 650 mg 3 times daily as needed. 9. Aspirin 81 mg daily. 10.Aricept 10 mg daily. 11.Fish oil 1 capsule daily. 12.Haloperidol 5 mg daily at bedtime as needed. 13.Multivitamin 1 tablet daily. 14.Zoloft 50 mg 1 tablet daily. 15.Flomax 1 capsule daily. 16.Vitamin C 1000 mg daily.  DIET:  Low salt, low cholesterol, 1800 calories ADA diet.  FOLLOWUP:  Follow up with me in 1 week.  CONDITION AT DISCHARGE:  Stable.  BRIEF HISTORY AND HOSPITAL COURSE:  Mr. Gombert is 80 year old male with past medical history significant for coronary artery disease, history of MI 20+ years ago, dementia, benign hypertrophy of prostate, osteoarthritis, chronic tremors, questionable Parkinson's disease, he came to the ER by EMS and code STEMI was called.  The patient complained of retrosternal chest pain associated with diaphoresis and pale appearance followed by vomiting.  EKG done in the ED showed sinus tachycardia with Q-waves with minimal ST elevation in V1 and more than 2- mm ST-elevation in V2 and ST depression in V4 to V6.  As per family, the patient had never complained of chest pain before, the patient had significant dementia and very hard to get history.  The patient was noted to have high-grade fever of 103 in ED.  Per family, the patient had been occasionally coughing off and on.  The patient was noted to have low-grade fever at home.  The patient's activity is very limited but is able to  walk at home.  Discussed with patient's family at length, various options of treatment, i.e., medical versus emergency cath, possible PTCA stenting, its risks and benefits, i.e., death, MI, stroke, local vascular complications, etc., and family requested for everything to be done.  Of note, code STEMI was canceled initially in ED and subsequently as per family's wishes, cath team was called again.  PAST MEDICAL HISTORY:  As above.  PHYSICAL EXAMINATION:  GENERAL:  He was  awake. VITAL SIGNS:  Blood pressure was 127/55, pulse was 105, tachycardiac, temperature was 103.8. HEENT:  Conjunctivae pink. NECK:  Supple.  No JVD. LUNGS:  Decreased breath sounds at bases. CARDIOVASCULAR EXAM:  He was tachycardic.  S1, S2 was soft.  There was soft systolic murmur noted. ABDOMEN:  Soft.  Bowel sounds were present. EXTREMITIES:  There was no clubbing, cyanosis, or edema.  LABORATORY DATA:  Admission labs; sodium was 139, potassium 4.1, BUN 26, creatinine 1.21.  His hemoglobin was 12, hematocrit 36.9, platelet count was 74,000, white count of 2.3.  His first set of troponin I by point of care was minimally elevated 0.10, repeat troponin I by lab was 3.27. His lactic acid level was 2.45.  Blood cultures was positive for E. coli and Klebsiella pneumonia.  Repeat troponin I was 3.45 post PCI and then, 2.90 and 2.05 which is trending down.  Post PCI, his BUN was 33, creatinine 1.73.  Yesterday, his BUN was 18, creatinine 1.41 which is trending down, potassium is 3.5.  Hemoglobin is 10.7, hematocrit 32.7, white count is 5.8, platelet count has improved to 162,000.  DIAGNOSTIC DATA:  The patient had CT of the abdomen yesterday which showed no evidence of abscesses, progressive extrahepatic biliary duct dilatation appears to be secondary to choledocholithiasis, no abscess was noted.  There was abdominal aortic aneurysm, common iliac artery aneurysm was noted.  Chest x-ray showed no active cardiopulmonary disease.  Repeat blood cultures, no growth so far.  BRIEF HOSPITAL COURSE:  The patient was emergently taken to the cath lab and underwent left cardiac cath with selective left and right coronary angiography and PTCA stenting to proximal and mid junction of LAD.  As per procedure report, the patient tolerated procedure well and there were no complications.  Postprocedure, the patient did not have any episodes of chest pain.  Also, pan cultures were obtained and  the patient was started on vancomycin and Zosyn.  ID consultation was obtained as patient had 3/3 blood cultures positive for Klebsiella and E. coli.  His vancomycin was discontinued and was continued on Zosyn which was switched to Rocephin and then, p.o. Cipro today.  The patient had developed erythematous rash just on the back which was attributed to most likely heat rash, was felt not to be due to antibiotic associated allergic dermatitis.  The patient remained afebrile during the hospital stay.  OT/PT consultation was obtained.  The patient as per family is more alert and awake and has ambulated in room, without any problems. The patient will be discharged home on above medications and will be followed up in my office in 1 week.     Allegra Lai. Terrence Dupont, M.D.     MNH/MEDQ  D:  04/03/2014  T:  04/03/2014  Job:  [id number]

## 2014-04-03 NOTE — Progress Notes (Signed)
Notified Dr. Terrence Dupont that pt will not be discharged to a facility today. Pt to be discharged tomorrow to facility.

## 2014-04-03 NOTE — Progress Notes (Signed)
CSW (Clinical Education officer, museum) sent PT note to facility to submit for insurance authorization.  [place], [place]

## 2014-04-03 NOTE — Progress Notes (Signed)
[name]  Day #2 rocephin  6 d zosyn   Subjective: Rash on back   Antibiotics:  Anti-infectives    Start     Dose/Rate Route Frequency Ordered Stop   04/02/14 1100  cefTRIAXone (ROCEPHIN) 2 g in dextrose 5 % 50 mL IVPB     2 g100 mL/hr over 30 Minutes Intravenous Every 24 hours 04/02/14 1036     03/30/14 2300  vancomycin (VANCOCIN) IVPB 750 mg/150 ml premix  Status:  Discontinued     750 mg150 mL/hr over 60 Minutes Intravenous Every 24 hours 03/30/14 1001 03/30/14 1454   03/28/14 2300  vancomycin (VANCOCIN) IVPB 1000 mg/200 mL premix  Status:  Discontinued     1,000 mg200 mL/hr over 60 Minutes Intravenous Every 24 hours 03/28/14 2242 03/30/14 1001   03/28/14 2300  piperacillin-tazobactam (ZOSYN) IVPB 3.375 g  Status:  Discontinued     3.375 g12.5 mL/hr over 240 Minutes Intravenous 3 times per day 03/28/14 2242 04/02/14 1036      Medications: Scheduled Meds: . antiseptic oral rinse  7 mL Mouth Rinse BID  . aspirin  81 mg Oral Daily  . atorvastatin  40 mg Oral q1800  . cefTRIAXone (ROCEPHIN)  IV  2 g Intravenous Q24H  . ferrous sulfate  325 mg Oral TID WC  . hydrocortisone cream   Topical BID  . insulin aspart  0-9 Units Subcutaneous TID WC  . metoprolol tartrate  12.5 mg Oral BID  . sertraline  50 mg Oral Daily  . tamsulosin  0.4 mg Oral Daily  . ticagrelor  90 mg Oral BID   Continuous Infusions: . sodium chloride 50 mL/hr at 04/02/14 0406   PRN Meds:.acetaminophen, bisacodyl, LORazepam, nitroGLYCERIN, ondansetron (ZOFRAN) IV    Objective: Weight change:   Intake/Output Summary (Last 24 hours) at 04/03/14 0938 Last data filed at 04/03/14 0532  Gross per 24 hour  Intake    600 ml  Output    275 ml  Net    325 ml   Blood pressure 139/62, pulse 78, temperature 98.1 F (36.7 C), temperature source Oral, resp. rate 17, height 5\' 11"  (1.803 m), weight 142 lb 12.8 oz (64.774 kg), SpO2 96 %. Temp:  [98 F (36.7 C)-98.6 F (37 C)]  98.1 F (36.7 C) (11/10 0734) Pulse Rate:  [71-84] 78 (11/10 0734) Resp:  [15-21] 17 (11/10 0734) BP: (125-140)/(56-76) 139/62 mmHg (11/10 0734) SpO2:  [96 %-100 %] 96 % (11/10 0734) Weight:  [142 lb 12.8 oz (64.774 kg)] 142 lb 12.8 oz (64.774 kg) (11/10 0400)  Physical Exam: General: Alert and awake, oriented x3, frail, HOH HEENT: anicteric sclera, pupils reactive to light and accommodation, EOMI CVS regular rate, normal r,  no murmur rubs or gallops Chest: clear to auscultation bilaterally, no wheezing, rales or rhonchi, voluntary guarding of abdomen Abdomen: soft nontender, nondistended, normal bowel sounds, Extremities: no  clubbing or edema noted bilaterally Skin: ecchymoses   Rash on back, but not on chest see picture:      Neuro: nonfocal  CBC:  Recent Labs Lab 03/28/14 1923 03/29/14 0002 03/30/14 0225 03/31/14 0304 04/02/14 0920  HGB 12.0* 10.3* 9.5* 9.5* 10.7*  HCT 36.9* 31.2* 28.1* 28.9* 32.7*  PLT 74* 77* 92* 95* 162  INR 1.10  --   --   --   --      BMET  Recent Labs  04/02/14 0920  NA 143  K 3.5*  CL 106  CO2 24  GLUCOSE 122*  BUN 18  CREATININE 1.41*  CALCIUM 8.5     Liver Panel   Recent Labs  03/31/14 1145  PROT 5.2*  ALBUMIN 2.6*  AST 39*  ALT 170*  ALKPHOS 129*  BILITOT 1.0  BILIDIR 0.5*  IBILI 0.5       Sedimentation Rate No results for input(s): ESRSEDRATE in the last 72 hours. C-Reactive Protein No results for input(s): CRP in the last 72 hours.  Micro Results: Recent Results (from the past 720 hour(s))  Blood culture (routine x 2)     Status: None   Collection Time: 03/28/14  7:30 PM  Result Value Ref Range Status   Specimen Description BLOOD FOREARM RIGHT  Final   Special Requests BOTTLES DRAWN AEROBIC AND ANAEROBIC 5CC  Final   Culture  Setup Time   Final    03/29/2014 03:47 Performed at [place] Note: Gram Stain Report Called  to,Read Back By and Verified With: CHARIS YIMBU 03/29/14 1435 BY SMITHERSJ Performed at [facility]    Report Status 04/02/2014 FINAL  Final   Organism ID, Bacteria KLEBSIELLA PNEUMONIAE  Final   Organism ID, Bacteria ESCHERICHIA COLI  Final      Susceptibility   Escherichia coli - MIC*    AMPICILLIN 8 SENSITIVE Sensitive     AMPICILLIN/SULBACTAM 4 SENSITIVE Sensitive     CEFAZOLIN <=4 SENSITIVE Sensitive     CEFEPIME <=1 SENSITIVE Sensitive     CEFTAZIDIME <=1 SENSITIVE Sensitive     CEFTRIAXONE <=1 SENSITIVE Sensitive     CIPROFLOXACIN <=0.25 SENSITIVE Sensitive     GENTAMICIN <=1 SENSITIVE Sensitive     IMIPENEM <=0.25 SENSITIVE Sensitive     PIP/TAZO <=4 SENSITIVE Sensitive     TOBRAMYCIN <=1 SENSITIVE Sensitive     TRIMETH/SULFA <=20 SENSITIVE Sensitive     * ESCHERICHIA COLI   Klebsiella pneumoniae - MIC*    AMPICILLIN RESISTANT      AMPICILLIN/SULBACTAM 4 SENSITIVE Sensitive     CEFAZOLIN <=4 SENSITIVE Sensitive     CEFEPIME <=1 SENSITIVE Sensitive     CEFTAZIDIME <=1 SENSITIVE Sensitive     CEFTRIAXONE <=1 SENSITIVE Sensitive     CIPROFLOXACIN <=0.25 SENSITIVE Sensitive     GENTAMICIN <=1 SENSITIVE Sensitive     IMIPENEM <=0.25 SENSITIVE Sensitive     PIP/TAZO <=4 SENSITIVE Sensitive     TOBRAMYCIN <=1 SENSITIVE Sensitive     TRIMETH/SULFA* <=20 SENSITIVE Sensitive      * SET UP TIME:  [id number]    * KLEBSIELLA PNEUMONIAE  MRSA PCR Screening     Status: None   Collection Time: 03/28/14 11:02 PM  Result Value Ref Range Status   MRSA by PCR NEGATIVE NEGATIVE Final    Comment:        The GeneXpert MRSA Assay (FDA approved for NASAL specimens only), is one component of a comprehensive MRSA colonization surveillance program. It is not intended to diagnose MRSA infection nor to guide or monitor treatment for MRSA infections.   Blood culture (routine x 2)     Status: None   Collection Time: 03/28/14 11:25 PM  Result Value Ref Range Status   Specimen  Description BLOOD LEFT UPPER WRIST  Final   Special Requests BOTTLES DRAWN AEROBIC ONLY 10CC  Final   Culture  Setup Time   Final    03/29/2014 03:47 Performed at [facility]  Final    GRAM NEGATIVE RODS ESCHERICHIA COLI Note: Gram Stain Report Called to,Read Back By and Verified With: CHARIS YIMBU 03/29/14 1435 BY SMITHERSJ Performed at [facility]    Report Status 04/02/2014 FINAL  Final  Culture, blood (routine x 2)     Status: None   Collection Time: 03/29/14 12:03 AM  Result Value Ref Range Status   Specimen Description BLOOD RIGHT ARM  Final   Special Requests BOTTLES DRAWN AEROBIC AND ANAEROBIC 8CC  Final   Culture  Setup Time   Final    03/29/2014 03:47 Performed at [place]    Culture   Final    [place] Note: Gram Stain Report Called to,Read Back By and Verified With: CHARIS YIMBU 03/29/14 1435 BY SMITHERSJ Performed at [facility]    Report Status 04/02/2014 FINAL  Final  Culture, Urine     Status: None   Collection Time: 03/29/14  3:00 AM  Result Value Ref Range Status   Specimen Description URINE, RANDOM  Final   Special Requests Normal  Final   Culture  Setup Time   Final    03/29/2014 11:02 Performed at [place]   Final    85,000 COLONIES/ML Performed at [facility]    Culture   Final    Multiple bacterial morphotypes present, none predominant. Suggest appropriate recollection if clinically indicated. Performed at [facility]    Report Status 03/31/2014 FINAL  Final  Culture, blood (routine x 2)     Status: None (Preliminary result)   Collection Time: 03/30/14  2:25 PM  Result Value Ref Range Status   Specimen Description BLOOD LEFT ARM  Final   Special Requests BOTTLES DRAWN AEROBIC ONLY 3CC  Final   Culture  Setup Time   Final    03/30/2014 20:28 Performed at [facility]    Culture   Final           BLOOD CULTURE  RECEIVED NO GROWTH TO DATE CULTURE WILL BE HELD FOR 5 DAYS BEFORE ISSUING A FINAL NEGATIVE REPORT Performed at [facility]    Report Status PENDING  Incomplete  Culture, blood (routine x 2)     Status: None (Preliminary result)   Collection Time: 03/30/14  2:30 PM  Result Value Ref Range Status   Specimen Description BLOOD RIGHT HAND  Final   Special Requests BOTTLES DRAWN AEROBIC AND ANAEROBIC 10CC  Final   Culture  Setup Time   Final    03/30/2014 20:28 Performed at [facility]    Culture   Final           BLOOD CULTURE RECEIVED NO GROWTH TO DATE CULTURE WILL BE HELD FOR 5 DAYS BEFORE ISSUING A FINAL NEGATIVE REPORT Performed at [facility]    Report Status PENDING  Incomplete    Studies/Results: Ct Abdomen Pelvis Wo Contrast  04/02/2014   CLINICAL DATA:  Bacteremia, evaluate for abscess.  EXAM: CT ABDOMEN AND PELVIS WITHOUT CONTRAST  TECHNIQUE: Multidetector CT imaging of the abdomen and pelvis was performed following the standard protocol without IV contrast.  COMPARISON:  Ultrasound abdomen 03/30/2014 and CT abdomen 01/14/2009.  FINDINGS: Lower chest: Lung bases show probable atelectasis dependently. Heart is mildly enlarged. Coronary artery calcification. No pericardial or pleural effusion.  Hepatobiliary: Liver is unremarkable. Cholecystectomy. Extrahepatic bile duct measures 2.5 cm (previously 2.0 cm). A 4 mm calcification is seen in the  lower common bile duct (series 201, image 34).  Pancreas: Negative.  Spleen: Negative.  Adrenals/Urinary Tract: Low-attenuation lesions in the kidneys measure up to 1.9 cm off the lower pole left kidney, similar to the prior exam. There are also low-attenuation lesions in the renal sinuses bilaterally. Ureters are decompressed. Slight bladder wall thickening may be due to underdistention.  Stomach/Bowel: Stomach, small bowel, appendix and colon are unremarkable.  Vascular/Lymphatic: Atherosclerotic calcification of the  arterial vasculature. Aorta measures up to 3.4 x 3.6 cm above the renal arteries. Left common iliac artery measures 2.2 cm and right common iliac artery, 1.8 cm. No pathologically enlarged lymph nodes.  Reproductive: Prostate is enlarged.  Other: No free fluid. No organized fluid collection. Right inguinal hernia repair. Mesenteries and peritoneum are unremarkable.  Musculoskeletal: No worrisome lytic or sclerotic lesions. Degenerative changes are seen in the spine.  IMPRESSION: 1. Progressive extrahepatic biliary duct dilatation appears to be secondary to choledocholithiasis. 2. No abscess. 3. Coronary artery calcification. 4. Abdominal aortic aneurysm.  Left common iliac artery aneurysm.   Electronically Signed   By: [name] M.D.   On: 04/02/2014 17:00      Assessment/Plan:  Active Problems:   Acute coronary syndrome   Bacteremia   Klebsiella sepsis   E coli bacteremia   Polymicrobial bacterial infection   Gallstone    [name] Wimbush Sr. is a 80 y.o. male with  Polymicrobial bacteremia with septic shock NSTEMI, sp PCI and stent  #1 Polymicrobial bacteremia and septic shock from Klebsiella PNA and E coli (both organisms persistently isolated from blood on first 2 days of admission). CT abdomen and pelvis without abscess.  Will change him to Ciprofloxacin 500mg  po BID for 8 more days  I dislike risk for CDI with Cipro or delirium in the elderly but it is VERY bioavailable and unlikely to be blamed for rashes    #2 Rash: more likely a "heat rash" as only on back rather than rxn to zosyn he got for 5 days or rocephin However I will change to FQ as above   I will sign off for now  Please call with further questions.      LOS: 6 days   Alcide Evener 04/03/2014, 9:38 AM

## 2014-04-04 LAB — GLUCOSE, CAPILLARY
GLUCOSE-CAPILLARY: 92 mg/dL (ref 70–99)
Glucose-Capillary: 96 mg/dL (ref 70–99)

## 2014-04-04 NOTE — Progress Notes (Signed)
CSW (Clinical Education officer, museum) received call from [facility] that they had received insurance authorization. CSW prepared pt dc packet and placed with shadow chart. CSW arranged non-emergent ambulance transport. Pt, pt family, pt nurse, and facility informed. CSW signing off.   [place], [place]

## 2014-04-04 NOTE — Progress Notes (Signed)
Subjective:  Patient up in chair denies any chest pain or shortness of breath awaiting skilled nursing facility. Back rash is improving.  Objective:  Vital Signs in the last 24 hours: Temp:  [98.1 F (36.7 C)-98.3 F (36.8 C)] 98.1 F (36.7 C) (11/11 0523) Pulse Rate:  [73-82] 80 (11/11 0523) Resp:  [16-20] 20 (11/11 0523) BP: (119-139)/(48-68) 137/68 mmHg (11/11 0523) SpO2:  [96 %-100 %] 98 % (11/11 0523) Weight:  [67.677 kg (149 lb 3.2 oz)] 67.677 kg (149 lb 3.2 oz) (11/11 0523)  Intake/Output from previous day: 11/10 0701 - 11/11 0700 In: 120 [P.O.:120] Out: -  Intake/Output from this shift:    Physical Exam: Neck: no adenopathy, no carotid bruit, no JVD and supple, symmetrical, trachea midline Lungs: clear to auscultation bilaterally Heart: regular rate and rhythm, S1, S2 normal and soft systolic murmur noted no S3 gallop Abdomen: soft, non-tender; bowel sounds normal; no masses,  no organomegaly Extremities: extremities normal, atraumatic, no cyanosis or edema  Lab Results:  Recent Labs  04/02/14 0920  WBC 5.8  HGB 10.7*  PLT 162    Recent Labs  04/02/14 0920  NA 143  K 3.5*  CL 106  CO2 24  GLUCOSE 122*  BUN 18  CREATININE 1.41*   No results for input(s): TROPONINI in the last 72 hours.  Invalid input(s): CK, MB Hepatic Function Panel No results for input(s): PROT, ALBUMIN, AST, ALT, ALKPHOS, BILITOT, BILIDIR, IBILI in the last 72 hours. No results for input(s): CHOL in the last 72 hours. No results for input(s): PROTIME in the last 72 hours.  Imaging: Imaging results have been reviewed and Ct Abdomen Pelvis Wo Contrast  04/02/2014   CLINICAL DATA:  Bacteremia, evaluate for abscess.  EXAM: CT ABDOMEN AND PELVIS WITHOUT CONTRAST  TECHNIQUE: Multidetector CT imaging of the abdomen and pelvis was performed following the standard protocol without IV contrast.  COMPARISON:  Ultrasound abdomen 03/30/2014 and CT abdomen 01/14/2009.  FINDINGS: Lower chest:  Lung bases show probable atelectasis dependently. Heart is mildly enlarged. Coronary artery calcification. No pericardial or pleural effusion.  Hepatobiliary: Liver is unremarkable. Cholecystectomy. Extrahepatic bile duct measures 2.5 cm (previously 2.0 cm). A 4 mm calcification is seen in the lower common bile duct (series 201, image 34).  Pancreas: Negative.  Spleen: Negative.  Adrenals/Urinary Tract: Low-attenuation lesions in the kidneys measure up to 1.9 cm off the lower pole left kidney, similar to the prior exam. There are also low-attenuation lesions in the renal sinuses bilaterally. Ureters are decompressed. Slight bladder wall thickening may be due to underdistention.  Stomach/Bowel: Stomach, small bowel, appendix and colon are unremarkable.  Vascular/Lymphatic: Atherosclerotic calcification of the arterial vasculature. Aorta measures up to 3.4 x 3.6 cm above the renal arteries. Left common iliac artery measures 2.2 cm and right common iliac artery, 1.8 cm. No pathologically enlarged lymph nodes.  Reproductive: Prostate is enlarged.  Other: No free fluid. No organized fluid collection. Right inguinal hernia repair. Mesenteries and peritoneum are unremarkable.  Musculoskeletal: No worrisome lytic or sclerotic lesions. Degenerative changes are seen in the spine.  IMPRESSION: 1. Progressive extrahepatic biliary duct dilatation appears to be secondary to choledocholithiasis. 2. No abscess. 3. Coronary artery calcification. 4. Abdominal aortic aneurysm.  Left common iliac artery aneurysm.   Electronically Signed   By: [name] M.D.   On: 04/02/2014 17:00    Cardiac Studies:  Assessment/Plan:  Status post acute anteroseptal wall MI status post PCI to proximal and mid junction of the LAD with  excellent results Coronary artery disease history of MI in the remote past Dementia Escherichia coli/Klebsiella bacteremia questionable source ResolvingAcute on chronic injury secondary to  CIN/hypotension/ACE inhibitor/antibiotics Acute on chronic anemia secondary to hydration rule out GI loss Benign hypertrophy of prostate Osteoarthritis Tremors questionable history of Parkinson's disease New-onset diabetes mellitus Status post bronchitis Plan Continue present management No changes in discharge summary Okay to discharge to skilled nursing facility  LOS: 7 days    Rudi Bunyard N 04/04/2014, 7:27 AM

## 2014-04-05 LAB — CULTURE, BLOOD (ROUTINE X 2)
CULTURE: NO GROWTH
Culture: NO GROWTH

## 2014-05-03 ENCOUNTER — Encounter (HOSPITAL_COMMUNITY): Payer: Self-pay | Admitting: Cardiology

## 2015-08-22 ENCOUNTER — Emergency Department: Payer: Medicare PPO

## 2015-08-22 ENCOUNTER — Encounter: Payer: Self-pay | Admitting: Emergency Medicine

## 2015-08-22 ENCOUNTER — Emergency Department
Admission: EM | Admit: 2015-08-22 | Discharge: 2015-08-22 | Disposition: A | Discharge status: Home / Self Care | Payer: Medicare PPO | Attending: Emergency Medicine | Admitting: Emergency Medicine

## 2015-08-22 DIAGNOSIS — Z880 Allergy status to penicillin: Secondary | ICD-10-CM | POA: Insufficient documentation

## 2015-08-22 DIAGNOSIS — Y998 Other external cause status: Secondary | ICD-10-CM | POA: Diagnosis not present

## 2015-08-22 DIAGNOSIS — Z792 Long term (current) use of antibiotics: Secondary | ICD-10-CM | POA: Diagnosis not present

## 2015-08-22 DIAGNOSIS — W1839XA Other fall on same level, initial encounter: Secondary | ICD-10-CM | POA: Diagnosis not present

## 2015-08-22 DIAGNOSIS — Y9389 Activity, other specified: Secondary | ICD-10-CM | POA: Diagnosis not present

## 2015-08-22 DIAGNOSIS — Z7982 Long term (current) use of aspirin: Secondary | ICD-10-CM | POA: Diagnosis not present

## 2015-08-22 DIAGNOSIS — Y92002 Bathroom of unspecified non-institutional (private) residence single-family (private) house as the place of occurrence of the external cause: Secondary | ICD-10-CM | POA: Insufficient documentation

## 2015-08-22 DIAGNOSIS — F0390 Unspecified dementia without behavioral disturbance: Secondary | ICD-10-CM | POA: Insufficient documentation

## 2015-08-22 DIAGNOSIS — Z87891 Personal history of nicotine dependence: Secondary | ICD-10-CM | POA: Diagnosis not present

## 2015-08-22 DIAGNOSIS — N390 Urinary tract infection, site not specified: Secondary | ICD-10-CM | POA: Diagnosis not present

## 2015-08-22 DIAGNOSIS — Z043 Encounter for examination and observation following other accident: Secondary | ICD-10-CM | POA: Diagnosis not present

## 2015-08-22 DIAGNOSIS — Z79899 Other long term (current) drug therapy: Secondary | ICD-10-CM | POA: Insufficient documentation

## 2015-08-22 DIAGNOSIS — R55 Syncope and collapse: Secondary | ICD-10-CM | POA: Diagnosis not present

## 2015-08-22 LAB — URINALYSIS COMPLETE WITH MICROSCOPIC (ARMC ONLY)
Bilirubin Urine: NEGATIVE
GLUCOSE, UA: NEGATIVE mg/dL
Ketones, ur: NEGATIVE mg/dL
Nitrite: POSITIVE — AB
PH: 5 (ref 5.0–8.0)
Protein, ur: 30 mg/dL — AB
SQUAMOUS EPITHELIAL / LPF: NONE SEEN
Specific Gravity, Urine: 1.015 (ref 1.005–1.030)

## 2015-08-22 LAB — BASIC METABOLIC PANEL
ANION GAP: 7 (ref 5–15)
BUN: 26 mg/dL — ABNORMAL HIGH (ref 6–20)
CO2: 29 mmol/L (ref 22–32)
Calcium: 9.2 mg/dL (ref 8.9–10.3)
Chloride: 102 mmol/L (ref 101–111)
Creatinine, Ser: 1.34 mg/dL — ABNORMAL HIGH (ref 0.61–1.24)
GFR, EST AFRICAN AMERICAN: 52 mL/min — AB (ref >60)
GFR, EST NON AFRICAN AMERICAN: 45 mL/min — AB (ref >60)
GLUCOSE: 223 mg/dL — AB (ref 65–99)
POTASSIUM: 4.1 mmol/L (ref 3.5–5.1)
SODIUM: 138 mmol/L (ref 135–145)

## 2015-08-22 LAB — CBC
HEMATOCRIT: 38.9 % — AB (ref 40.0–52.0)
HEMOGLOBIN: 13 g/dL (ref 13.0–18.0)
MCH: 30.6 pg (ref 26.0–34.0)
MCHC: 33.4 g/dL (ref 32.0–36.0)
MCV: 91.7 fL (ref 80.0–100.0)
Platelets: 145 10*3/uL — ABNORMAL LOW (ref 150–440)
RBC: 4.24 MIL/uL — AB (ref 4.40–5.90)
RDW: 14.2 % (ref 11.5–14.5)
WBC: 5.6 10*3/uL (ref 3.8–10.6)

## 2015-08-22 LAB — TROPONIN I

## 2015-08-22 MED ORDER — CIPROFLOXACIN HCL 500 MG PO TABS: 500.0000 mg | ORAL_TABLET | Freq: Two times a day (BID) | ORAL | Status: AC

## 2015-08-22 MED ORDER — CIPROFLOXACIN IN D5W 400 MG/200ML IV SOLN
400.0000 mg | Freq: Once | INTRAVENOUS | Status: AC
  Administered 2015-08-22: 400 mg via INTRAVENOUS
  Filled 2015-08-22: qty 200

## 2015-08-22 MED ORDER — SODIUM CHLORIDE 0.9 % IV SOLN
1000.0000 mL | Freq: Once | INTRAVENOUS | Status: AC
  Administered 2015-08-22: 1000 mL via INTRAVENOUS

## 2015-08-22 NOTE — ED Notes (Signed)
Patient transported to CT 

## 2015-08-22 NOTE — ED Notes (Signed)
Pt alert and oriented X4, active, cooperative, pt in NAD. RR even and unlabored, color WNL.  Pt informed to return if any life threatening symptoms occur.  Pt left with son. 

## 2015-08-22 NOTE — ED Notes (Signed)
Per [facility] EMS:  Called out for seizure, was attempting to have a BM on toilet when he called out to family that he could not, when they attempted to stand him he got real shaky and was lowered to floor by family.  He did not lose consciousness and remained alert.  When ems attempted to help him stand again he did the same thing.  He was pale and diaphoretic when they arrived, still pale now but EMS reports his color has improved without intervention.  His CBG was 128, BP 120/78, HR 72 NSR with 1st degree block.  Family reports dementia or parkinson's according to EMS.  Patient remains confused about date and cannot recall his date of birth.  Hard of hearing.

## 2015-08-22 NOTE — ED Provider Notes (Signed)
[name] Provider Note  ____________________________________________    I have reviewed the triage vital signs and the nursing notes.   HISTORY  Chief Complaint Near Syncope    HPI [name] DELFAVERO Sr. is a 82 y.o. male with a history of dementia who presents after a syncopal or seizure episode. Patient is cared for by his son. Son reports the patient was being helped to the bathroom by his wife when he apparently had a convulsive appearing episode and fell to the floor. The patient was caught and lowered carefully to the floor did not hit his head. He apparently made some "moaning" noises briefly thereafter and appeared more confused than typical. No history of seizures. No head injury. No recent fevers. Has otherwise been at his baseline. Son reports the patient is at his baseline now.     Past Medical History  Diagnosis Date  . Tremor   . OA (osteoarthritis)   . Anxiety   . Dementia     POA is [name]  . Hearing loss   . Myocardial infarction ([place])   . Hypertrophy of prostate without urinary obstruction and other lower urinary tract symptoms (LUTS)   . Urinary incontinence     Patient Active Problem List   Diagnosis Date Noted  . Heat rash   . Drug rash   . Bacteremia   . Klebsiella sepsis ([place])   . E coli bacteremia   . Polymicrobial bacterial infection   . Gallstone   . Acute coronary syndrome ([place]) 03/28/2014  . Ataxia 10/28/2011  . Chronic right hip pain 10/08/2011  . Hypertrophy of prostate without urinary obstruction and other lower urinary tract symptoms (LUTS)   . Urinary incontinence   . Dementia   . TREMOR, ESSENTIAL 06/25/2009  . OSTEOARTHRITIS, MULTIPLE JOINTS 06/25/2009  . ANXIETY 05/30/2008    Past Surgical History  Procedure Laterality Date  . Previous ent surgery      Presumably Septoplasty  . Cholecystectomy    . Skin biopsy      face  . Left heart catheterization with coronary  angiogram N/A 03/28/2014    Procedure: LEFT HEART CATHETERIZATION WITH CORONARY ANGIOGRAM;  Surgeon: Clent Demark, MD;  Location: [facility] CATH LAB;  Service: Cardiovascular;  Laterality: N/A;    Current Outpatient Rx  Name  Route  Sig  Dispense  Refill  . acetaminophen (TYLENOL) 650 MG CR tablet   Oral   Take 650 mg by mouth 3 (three) times daily with meals.         . Ascorbic Acid (VITAMIN C) 1000 MG tablet   Oral   Take 1,000 mg by mouth daily.         Marland Kitchen aspirin EC 81 MG tablet   Oral   Take 81 mg by mouth daily.         Marland Kitchen atorvastatin (LIPITOR) 40 MG tablet   Oral   Take 1 tablet (40 mg total) by mouth daily at 6 PM.   30 tablet   3   . bisacodyl (DULCOLAX) 5 MG EC tablet   Oral   Take 1 tablet (5 mg total) by mouth daily as needed for moderate constipation.   30 tablet   0   . ciprofloxacin (CIPRO) 500 MG tablet   Oral   Take 1 tablet (500 mg total) by mouth 2 (two) times daily.   16 tablet   0   . donepezil (ARICEPT) 10 MG tablet   Oral  Take 10 mg by mouth at bedtime.         . ferrous sulfate 325 (65 FE) MG tablet   Oral   Take 1 tablet (325 mg total) by mouth 3 (three) times daily with meals.   90 tablet   3   . haloperidol (HALDOL) 5 MG tablet   Oral   Take 5-10 mg by mouth at bedtime.         . metoprolol tartrate (LOPRESSOR) 12.5 mg TABS tablet   Oral   Take 0.5 tablets (12.5 mg total) by mouth 2 (two) times daily.   30 tablet   3   . Multiple Vitamins-Minerals (MULTIVITAL) tablet   Oral   Take 1 tablet by mouth daily.         . nitroGLYCERIN (NITROSTAT) 0.4 MG SL tablet   Sublingual   Place 1 tablet (0.4 mg total) under the tongue every 5 (five) minutes x 3 doses as needed for chest pain.   25 tablet   12   . Omega-3 Fatty Acids (FISH OIL) 1000 MG CAPS   Oral   Take 1 capsule by mouth daily.         . sertraline (ZOLOFT) 50 MG tablet   Oral   Take 1 tablet (50 mg total) by mouth daily.   30 tablet   6   . Tamsulosin  HCl (FLOMAX) 0.4 MG CAPS   Oral   Take 1 capsule (0.4 mg total) by mouth daily.   30 capsule   6   . ticagrelor (BRILINTA) 90 MG TABS tablet   Oral   Take 1 tablet (90 mg total) by mouth 2 (two) times daily.   60 tablet   11     Allergies Penicillins  Family History  Problem Relation Age of Onset  . Skin cancer Mother   . Breast cancer Daughter   . Arthritis Son   . Arthritis Son     Social History Social History  Substance Use Topics  . Smoking status: Former Smoker -- 1.00 packs/day for 20 years    Quit date: 05/25/1960  . Smokeless tobacco: Never Used     Comment: quit over 50 years ago  . Alcohol Use: No     Comment: wine occassionally    Level V caveat: Review of Systems Limited by dementia, son has provided what information he can  Constitutional: No fevers reported by son  ENT: No complaints of neck pain Cardiovascular: Negative for chest pain Respiratory: Negative for shortness of breath. No Cough Gastrointestinal: Negative for abdominal pain. No nausea or diarrhea Genitourinary: No foul smelling urine Musculoskeletal: Negative for back pain. Skin: Negative for rash. Neurological: Negative for focal weakness Psychiatric: no anxiety    ____________________________________________   PHYSICAL EXAM:  VITAL SIGNS: ED Triage Vitals  Enc Vitals Group     BP 08/22/15 1358 150/87 mmHg     Pulse Rate 08/22/15 1358 81     Resp 08/22/15 1358 18     Temp 08/22/15 1358 97.7 F (36.5 C)     Temp Source 08/22/15 1358 Oral     SpO2 08/22/15 1358 100 %     Weight 08/22/15 1358 130 lb (58.968 kg)     Height 08/22/15 1358 6\' 1"  (1.854 m)     Head Cir --      Peak Flow --      Pain Score 08/22/15 1442 0     Pain Loc --      Pain  Edu? --      Excl. in [place]? --      Constitutional: Alert . Well appearing and in no distress.  Eyes: Conjunctivae are normal. No erythema or injection ENT   Head: Normocephalic and atraumatic.   Mouth/Throat: Mucous  membranes are moist. Cardiovascular: Normal rate, irregularly irregular rhythm. Normal and symmetric distal pulses are present in the upper extremities.  Respiratory: Normal respiratory effort without tachypnea nor retractions. Breath sounds are clear and equal bilaterally.  Gastrointestinal: Soft and non-tender in all quadrants. No distention. There is no CVA tenderness. Genitourinary: deferred Musculoskeletal: Nontender with normal range of motion in all extremities. No lower extremity tenderness nor edema. Neurologic:  Normal speech and language. No gross focal neurologic deficits are appreciated. Cranial nerves II through XII are normal Skin:  Skin is warm, dry and intact. No rash noted. Psychiatric: Mood and affect are normal.  ____________________________________________    LABS (pertinent positives/negatives)  Labs Reviewed  URINALYSIS COMPLETEWITH MICROSCOPIC ([facility] ONLY) - Abnormal; Notable for the following:    Color, Urine AMBER (*)    APPearance TURBID (*)    Hgb urine dipstick 1+ (*)    Protein, ur 30 (*)    Nitrite POSITIVE (*)    Leukocytes, UA 2+ (*)    Bacteria, UA MANY (*)    All other components within normal limits  BASIC METABOLIC PANEL  CBC  TROPONIN I  CBG MONITORING, ED    ____________________________________________   EKG  ED ECG REPORT I, Lavonia Drafts, the attending physician, personally viewed and interpreted this ECG.   Date: 08/22/2015  EKG Time: 1:54 PM  Rate: 77  Rhythm: Atrial flutter  Axis: Normal    ST&T Change: Nonspecific   ____________________________________________    RADIOLOGY  CT head shows chronic encephalomalacia but otherwise no acute disease Chest x-ray is unremarkable   ____________________________________________   PROCEDURES  Procedure(s) performed: none  Critical Care performed: none  ____________________________________________   INITIAL IMPRESSION / ASSESSMENT AND PLAN / ED COURSE  Pertinent  labs & imaging results that were available during my care of the patient were reviewed by me and considered in my medical decision making (see chart for details).  Patient presents after either syncopal episode with myoclonic jerks or possible seizure. He does have chronic encephalomalacia on CT head making seizuresMore likely/possible.   ----------------------------------------- 5:03 PM on 08/22/2015 -----------------------------------------  Had a long discussion with family. Patient apparently had a relatively rapid improvement in symptoms. He also was "white as a ghost" and had just stood up. This seems to be more consistent with syncopal episode. We are giving him IV fluids. He also has a urinary tract infection for which we are giving IV antibiotics. I did offer admission but after discussion with the family they feel comfortable taking care of him at home which I think is reasonable. I did clarify that I cannot be certain this wasn't a seizure. We did discuss about return precautions  ____________________________________________   FINAL CLINICAL IMPRESSION(S) / ED DIAGNOSES  Final diagnoses:  Syncope, unspecified syncope type  UTI (lower urinary tract infection)          Lavonia Drafts, MD 08/22/15 1943

## 2015-08-22 NOTE — ED Notes (Signed)
Called lab about cbc, bmp, and troponin.

## 2015-08-22 NOTE — Discharge Instructions (Signed)
Syncope °Syncope is a medical term for fainting or passing out. This means you lose consciousness and drop to the ground. People are generally unconscious for less than 5 minutes. You may have some muscle twitches for up to 15 seconds before waking up and returning to normal. Syncope occurs more often in older adults, but it can happen to anyone. While most causes of syncope are not dangerous, syncope can be a sign of a serious medical problem. It is important to seek medical care.  °CAUSES  °Syncope is caused by a sudden drop in blood flow to the brain. The specific cause is often not determined. Factors that can bring on syncope include: °· Taking medicines that lower blood pressure. °· Sudden changes in posture, such as standing up quickly. °· Taking more medicine than prescribed. °· Standing in one place for too long. °· Seizure disorders. °· Dehydration and excessive exposure to heat. °· Low blood sugar (hypoglycemia). °· Straining to have a bowel movement. °· Heart disease, irregular heartbeat, or other circulatory problems. °· Fear, emotional distress, seeing blood, or severe pain. °SYMPTOMS  °Right before fainting, you may: °· Feel dizzy or light-headed. °· Feel nauseous. °· See all white or all black in your field of vision. °· Have cold, clammy skin. °DIAGNOSIS  °Your health care provider will ask about your symptoms, perform a physical exam, and perform an electrocardiogram (ECG) to record the electrical activity of your heart. Your health care provider may also perform other heart or blood tests to determine the cause of your syncope which may include: °· Transthoracic echocardiogram (TTE). During echocardiography, sound waves are used to evaluate how blood flows through your heart. °· Transesophageal echocardiogram (TEE). °· Cardiac monitoring. This allows your health care provider to monitor your heart rate and rhythm in real time. °· Holter monitor. This is a portable device that records your  heartbeat and can help diagnose heart arrhythmias. It allows your health care provider to track your heart activity for several days, if needed. °· Stress tests by exercise or by giving medicine that makes the heart beat faster. °TREATMENT  °In most cases, no treatment is needed. Depending on the cause of your syncope, your health care provider may recommend changing or stopping some of your medicines. °HOME CARE INSTRUCTIONS °· Have someone stay with you until you feel stable. °· Do not drive, use machinery, or play sports until your health care provider says it is okay. °· Keep all follow-up appointments as directed by your health care provider. °· Lie down right away if you start feeling like you might faint. Breathe deeply and steadily. Wait until all the symptoms have passed. °· Drink enough fluids to keep your urine clear or pale yellow. °· If you are taking blood pressure or heart medicine, get up slowly and take several minutes to sit and then stand. This can reduce dizziness. °SEEK IMMEDIATE MEDICAL CARE IF:  °· You have a severe headache. °· You have unusual pain in the chest, abdomen, or back. °· You are bleeding from your mouth or rectum, or you have black or tarry stool. °· You have an irregular or very fast heartbeat. °· You have pain with breathing. °· You have repeated fainting or seizure-like jerking during an episode. °· You faint when sitting or lying down. °· You have confusion. °· You have trouble walking. °· You have severe weakness. °· You have vision problems. °If you fainted, call your local emergency services (911 in [place] Do not drive   yourself to the hospital.    This information is not intended to replace advice given to you by your health care provider. Make sure you discuss any questions you have with your health care provider.   Document Released: 05/11/2005 Document Revised: 09/25/2014 Document Reviewed: 07/10/2011 Elsevier Interactive Patient Education 2016 [employer]  Urinary Tract Infection A urinary tract infection (UTI) can occur any place along the urinary tract. The tract includes the kidneys, ureters, bladder, and urethra. A type of germ called bacteria often causes a UTI. UTIs are often helped with antibiotic medicine.  HOME CARE   If given, take antibiotics as told by your doctor. Finish them even if you start to feel better.  Drink enough fluids to keep your pee (urine) clear or pale yellow.  Avoid tea, drinks with caffeine, and bubbly (carbonated) drinks.  Pee often. Avoid holding your pee in for a long time.  Pee before and after having sex (intercourse).  Wipe from front to back after you poop (bowel movement) if you are a woman. Use each tissue only once. GET HELP RIGHT AWAY IF:   You have back pain.  You have lower belly (abdominal) pain.  You have chills.  You feel sick to your stomach (nauseous).  You throw up (vomit).  Your burning or discomfort with peeing does not go away.  You have a fever.  Your symptoms are not better in 3 days. MAKE SURE YOU:   Understand these instructions.  Will watch your condition.  Will get help right away if you are not doing well or get worse.   This information is not intended to replace advice given to you by your health care provider. Make sure you discuss any questions you have with your health care provider.   Document Released: 10/28/2007 Document Revised: 06/01/2014 Document Reviewed: 12/10/2011 Elsevier Interactive Patient Education [employer].

## 2015-08-22 NOTE — ED Notes (Signed)
Patient transported to X-ray 

## 2015-10-04 IMAGING — CT CT ABD-PELV W/O CM
2 of 4 series · 4 of 46 positions shown, 6 images · non-contrast
Comparison: Ultrasound abdomen 03/30/2014 and CT abdomen
01/14/2009.

CLINICAL DATA: Bacteremia, evaluate for abscess.

EXAM:
CT ABDOMEN AND PELVIS WITHOUT CONTRAST
TECHNIQUE: Multidetector CT imaging of the abdomen and pelvis was performed
following the standard protocol without IV contrast.

[Series 204: cor · coronal · 0.50mm/px · 3 of 111 slices shown, 4 images]
[im 25/111  soft-tissue]
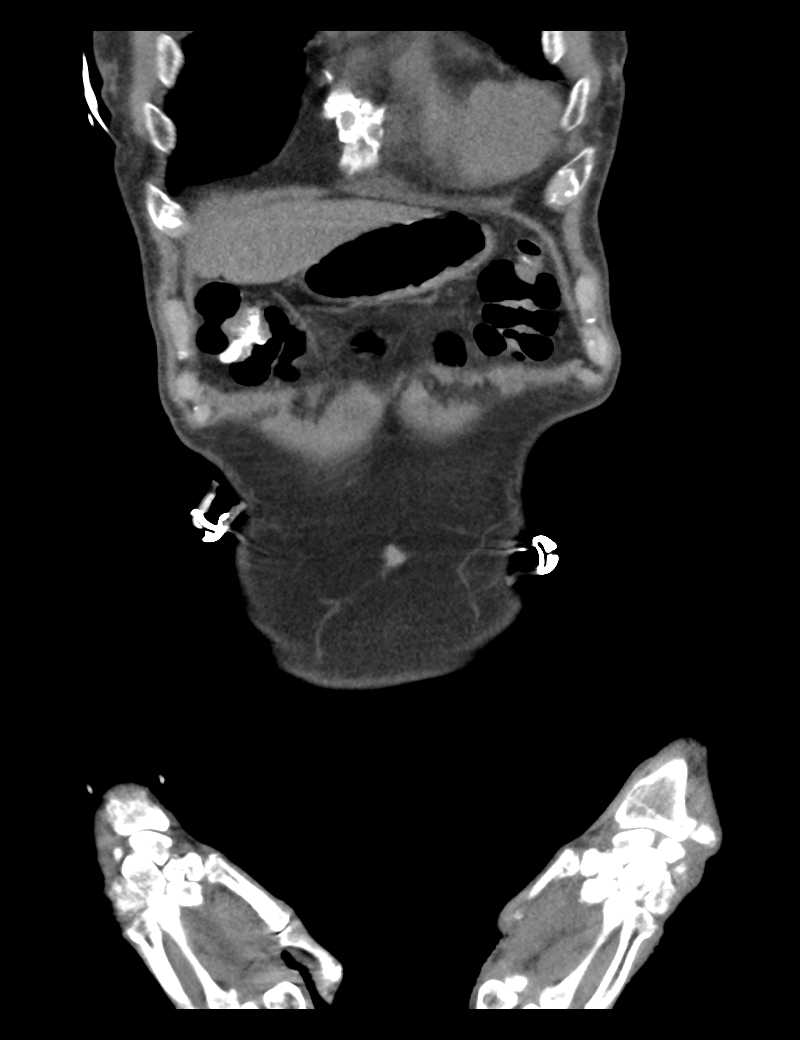
[im 25/111  bone]
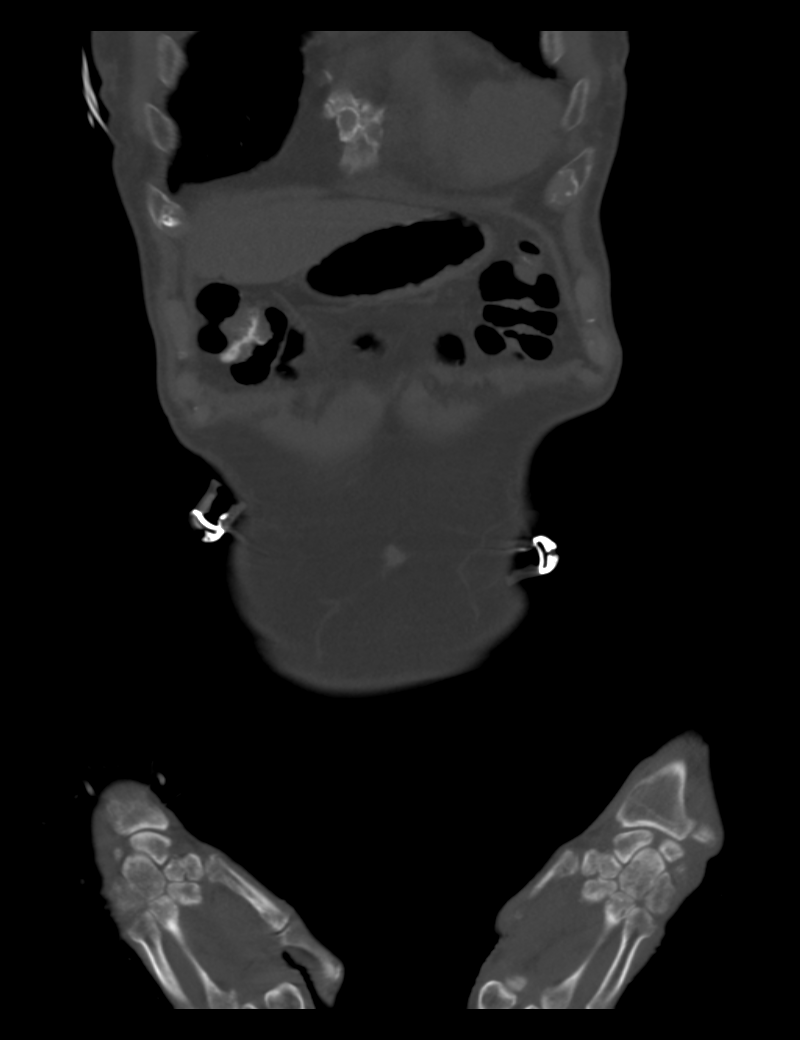
[im 62/111  soft-tissue]
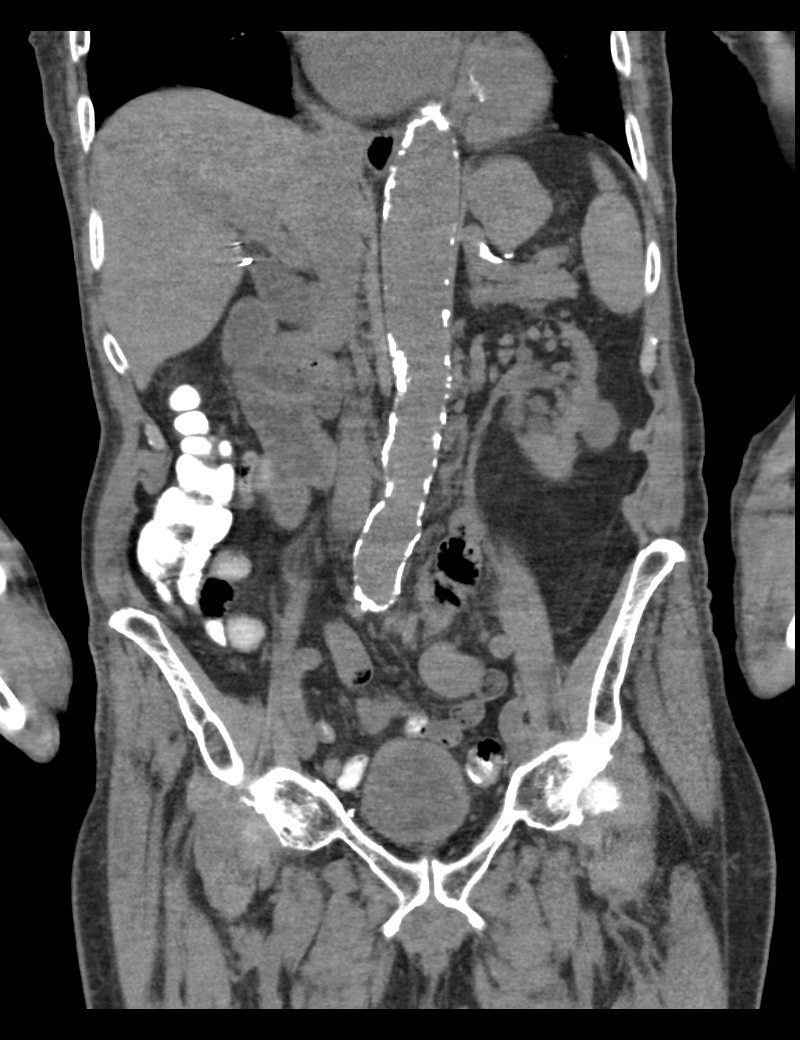
[im 86/111  soft-tissue]
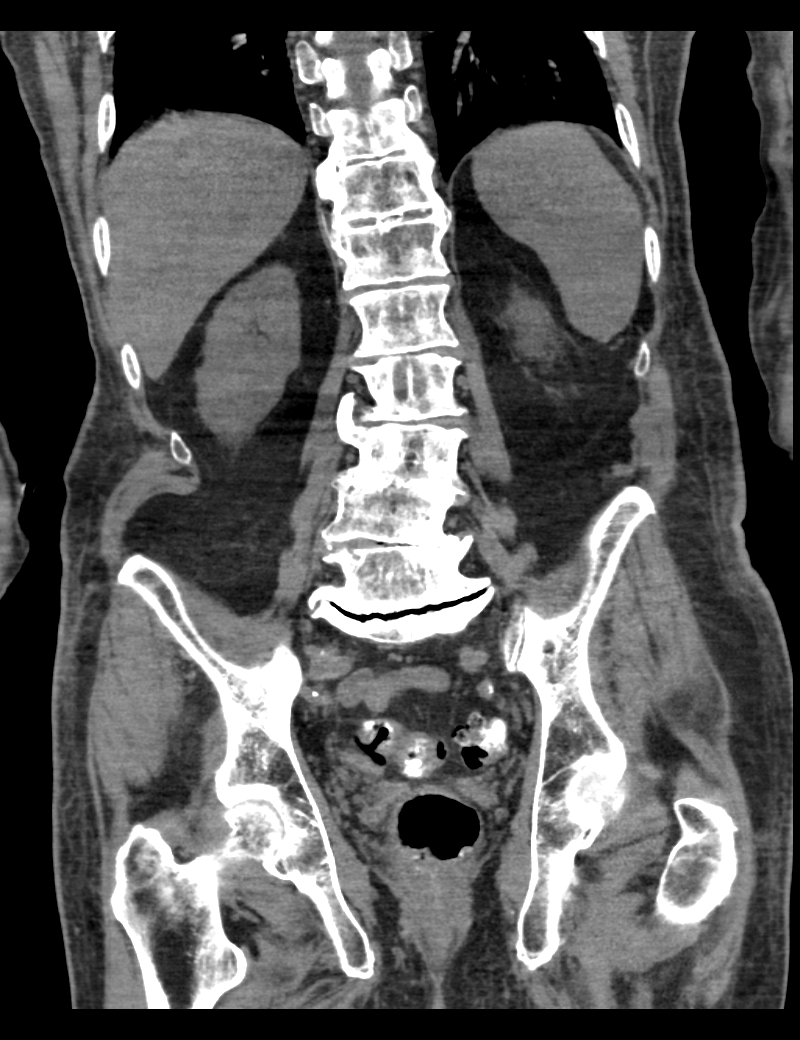

[Series 205: sag · sagittal · 0.50mm/px · 1 of 155 slices shown, 2 images]
[im 52/155  soft-tissue]
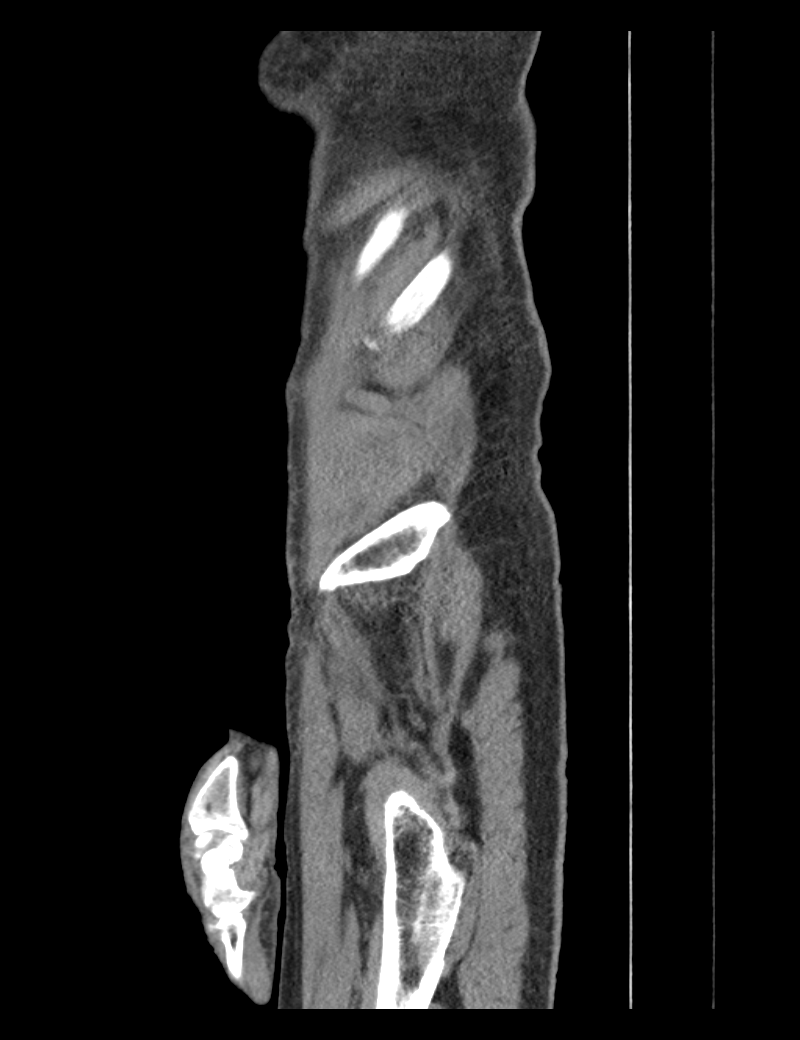
[im 52/155  bone]
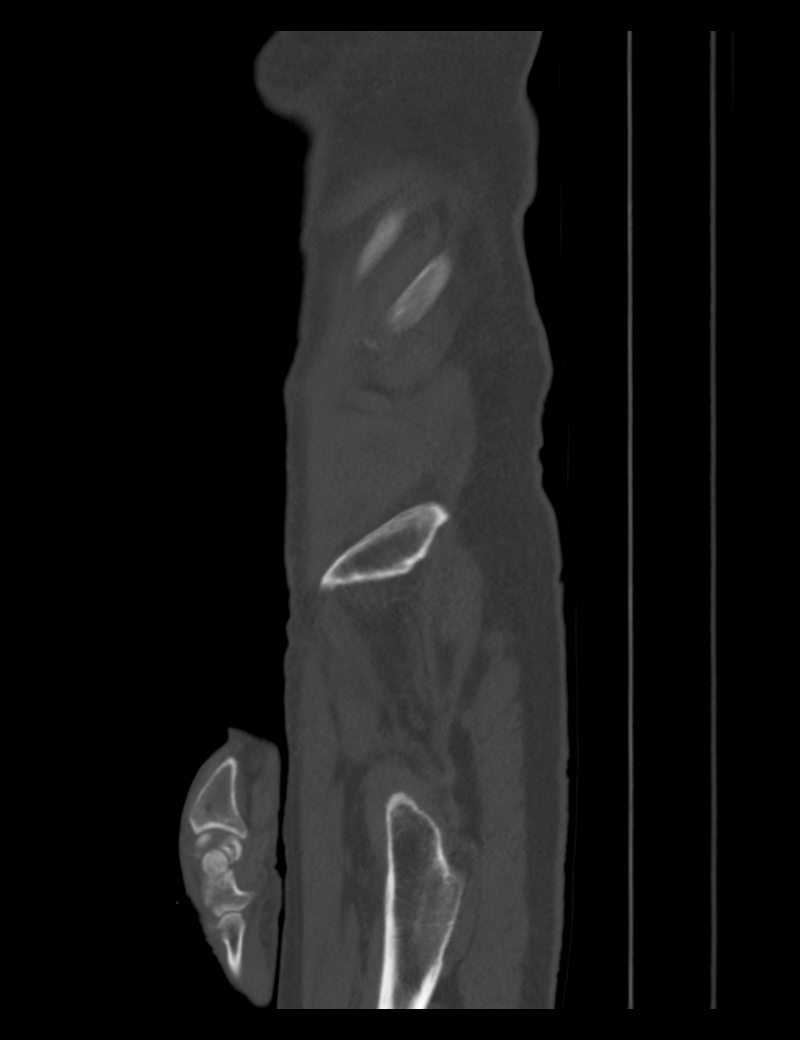

[4 of 46 positions shown; findings below may reference images not displayed]

FINDINGS: Lower chest: Lung bases show probable atelectasis dependently. Heart
is mildly enlarged. Coronary artery calcification. No pericardial or
pleural effusion.

Hepatobiliary: Liver is unremarkable. Cholecystectomy. Extrahepatic
bile duct measures 2.5 cm (previously 2.0 cm). A 4 mm calcification
is seen in the lower common bile duct (series 201, image 34).

Pancreas: Negative.

Spleen: Negative.

Adrenals/Urinary Tract: Low-attenuation lesions in the kidneys
measure up to 1.9 cm off the lower pole left kidney, similar to the
prior exam. There are also low-attenuation lesions in the renal
sinuses bilaterally. Ureters are decompressed. Slight bladder wall
thickening may be due to underdistention.

Stomach/Bowel: Stomach, small bowel, appendix and colon are
unremarkable.

Vascular/Lymphatic: Atherosclerotic calcification of the arterial
vasculature. Aorta measures up to 3.4 x 3.6 cm above the renal
arteries. Left common iliac artery measures 2.2 cm and right common
iliac artery, 1.8 cm. No pathologically enlarged lymph nodes.

Reproductive: Prostate is enlarged.

Other: No free fluid. No organized fluid collection. Right inguinal
hernia repair. Mesenteries and peritoneum are unremarkable.

Musculoskeletal: No worrisome lytic or sclerotic lesions.
Degenerative changes are seen in the spine.
IMPRESSION: 1. Progressive extrahepatic biliary duct dilatation appears to be
secondary to choledocholithiasis.
2. No abscess.
3. Coronary artery calcification.
4. Abdominal aortic aneurysm.  Left common iliac artery aneurysm.

## 2015-11-13 ENCOUNTER — Encounter: Payer: Self-pay | Admitting: Emergency Medicine

## 2015-11-13 ENCOUNTER — Emergency Department: Payer: Medicare PPO

## 2015-11-13 ENCOUNTER — Emergency Department
Admission: EM | Admit: 2015-11-13 | Discharge: 2015-11-13 | Disposition: A | Discharge status: Home / Self Care | Payer: Medicare PPO | Attending: Emergency Medicine | Admitting: Emergency Medicine

## 2015-11-13 DIAGNOSIS — Z7982 Long term (current) use of aspirin: Secondary | ICD-10-CM | POA: Insufficient documentation

## 2015-11-13 DIAGNOSIS — Z87891 Personal history of nicotine dependence: Secondary | ICD-10-CM | POA: Insufficient documentation

## 2015-11-13 DIAGNOSIS — M1990 Unspecified osteoarthritis, unspecified site: Secondary | ICD-10-CM | POA: Insufficient documentation

## 2015-11-13 DIAGNOSIS — I252 Old myocardial infarction: Secondary | ICD-10-CM | POA: Diagnosis not present

## 2015-11-13 DIAGNOSIS — J189 Pneumonia, unspecified organism: Secondary | ICD-10-CM | POA: Diagnosis not present

## 2015-11-13 DIAGNOSIS — R05 Cough: Secondary | ICD-10-CM | POA: Diagnosis present

## 2015-11-13 LAB — COMPREHENSIVE METABOLIC PANEL
ALK PHOS: 63 U/L (ref 38–126)
ALT: 17 U/L (ref 17–63)
AST: 22 U/L (ref 15–41)
Albumin: 3.8 g/dL (ref 3.5–5.0)
Anion gap: 9 (ref 5–15)
BUN: 38 mg/dL — ABNORMAL HIGH (ref 6–20)
CALCIUM: 9 mg/dL (ref 8.9–10.3)
CO2: 30 mmol/L (ref 22–32)
CREATININE: 1.55 mg/dL — AB (ref 0.61–1.24)
Chloride: 101 mmol/L (ref 101–111)
GFR calc non Af Amer: 38 mL/min — ABNORMAL LOW (ref >60)
GFR, EST AFRICAN AMERICAN: 44 mL/min — AB (ref >60)
GLUCOSE: 131 mg/dL — AB (ref 65–99)
Potassium: 4.1 mmol/L (ref 3.5–5.1)
SODIUM: 140 mmol/L (ref 135–145)
Total Bilirubin: 0.7 mg/dL (ref 0.3–1.2)
Total Protein: 6.7 g/dL (ref 6.5–8.1)

## 2015-11-13 LAB — CBC WITH DIFFERENTIAL/PLATELET
Basophils Absolute: 0 10*3/uL (ref 0–0.1)
Basophils Relative: 0 %
EOS ABS: 0 10*3/uL (ref 0–0.7)
Eosinophils Relative: 0 %
HEMATOCRIT: 35.7 % — AB (ref 40.0–52.0)
HEMOGLOBIN: 11.9 g/dL — AB (ref 13.0–18.0)
LYMPHS ABS: 0.9 10*3/uL — AB (ref 1.0–3.6)
MCH: 30.9 pg (ref 26.0–34.0)
MCHC: 33.4 g/dL (ref 32.0–36.0)
MCV: 92.5 fL (ref 80.0–100.0)
Monocytes Absolute: 0.7 10*3/uL (ref 0.2–1.0)
Monocytes Relative: 7 %
NEUTROS ABS: 8.3 10*3/uL — AB (ref 1.4–6.5)
Platelets: 136 10*3/uL — ABNORMAL LOW (ref 150–440)
RBC: 3.85 MIL/uL — AB (ref 4.40–5.90)
RDW: 13.9 % (ref 11.5–14.5)
WBC: 10 10*3/uL (ref 3.8–10.6)

## 2015-11-13 LAB — LACTIC ACID, PLASMA: LACTIC ACID, VENOUS: 1.8 mmol/L (ref 0.5–2.0)

## 2015-11-13 LAB — TROPONIN I: Troponin I: 0.03 ng/mL (ref <0.031)

## 2015-11-13 MED ORDER — SODIUM CHLORIDE 0.9 % IV BOLUS (SEPSIS)
1000.0000 mL | Freq: Once | INTRAVENOUS | Status: AC
  Administered 2015-11-13: 1000 mL via INTRAVENOUS

## 2015-11-13 MED ORDER — LEVOFLOXACIN 500 MG PO TABS: 500.0000 mg | ORAL_TABLET | Freq: Every day | ORAL | Status: AC

## 2015-11-13 MED ORDER — LEVOFLOXACIN IN D5W 500 MG/100ML IV SOLN
500.0000 mg | Freq: Once | INTRAVENOUS | Status: AC
  Administered 2015-11-13: 500 mg via INTRAVENOUS
  Filled 2015-11-13: qty 100

## 2015-11-13 NOTE — Discharge Instructions (Signed)
Take levaquin daily for a week.   Stay hydrated.  See your doctor.   Return to ER if he has worse trouble breathing, cough, turning blue, shortness of breath, fever.

## 2015-11-13 NOTE — ED Notes (Signed)
Per family, pt needs to be seen for possible PNE.  Pt has a cough and congestion.

## 2015-11-13 NOTE — ED Notes (Signed)
Patient transported to X-ray 

## 2015-11-13 NOTE — ED Provider Notes (Addendum)
CSN: [id number]     Arrival date & time 11/13/15  1118 History   First MD Initiated Contact with Patient 11/13/15 1141     Chief Complaint  Patient presents with  . Cough  . Pneumonia     (Consider location/radiation/quality/duration/timing/severity/associated sxs/prior Treatment) The history is provided by the patient and a relative.  [name] Purl Sr. is a 82 y.o. male hx of MI, dementia Here presenting with cough, possible pneumonia. Patient has cough and congestion since yesterday. As per the daughter-in-law, he has worsening cough when he lays down. Morning, he had a coughing spell and had some shortness of breath. Denies any fevers at home. He is demented and unable to give much history. He is a previous history of MI but denies any chest pain.     Level V caveat- dementia    Past Medical History  Diagnosis Date  . Tremor   . OA (osteoarthritis)   . Anxiety   . Dementia     POA is [name]  . Hearing loss   . Myocardial infarction ([place])   . Hypertrophy of prostate without urinary obstruction and other lower urinary tract symptoms (LUTS)   . Urinary incontinence    Past Surgical History  Procedure Laterality Date  . Previous ent surgery      Presumably Septoplasty  . Cholecystectomy    . Skin biopsy      face  . Left heart catheterization with coronary angiogram N/A 03/28/2014    Procedure: LEFT HEART CATHETERIZATION WITH CORONARY ANGIOGRAM;  Surgeon: Clent Demark, MD;  Location: [name] CATH LAB;  Service: Cardiovascular;  Laterality: N/A;   Family History  Problem Relation Age of Onset  . Skin cancer Mother   . Breast cancer Daughter   . Arthritis Son   . Arthritis Son    Social History  Substance Use Topics  . Smoking status: Former Smoker -- 1.00 packs/day for 20 years    Quit date: 05/25/1960  . Smokeless tobacco: Never Used     Comment: quit over 50 years ago  . Alcohol Use: No     Comment: wine occassionally    Review of Systems   Respiratory: Positive for cough.   All other systems reviewed and are negative.     Allergies  Penicillins  Home Medications   Prior to Admission medications   Medication Sig Start Date End Date Taking? Authorizing Provider  acetaminophen (TYLENOL) 650 MG CR tablet Take 650 mg by mouth 3 (three) times daily with meals.    Historical Provider, MD  Ascorbic Acid (VITAMIN C) 1000 MG tablet Take 1,000 mg by mouth daily.    Historical Provider, MD  aspirin EC 81 MG tablet Take 81 mg by mouth daily.    Historical Provider, MD  atorvastatin (LIPITOR) 40 MG tablet Take 1 tablet (40 mg total) by mouth daily at 6 PM. 04/03/14   Charolette Forward, MD  bisacodyl (DULCOLAX) 5 MG EC tablet Take 1 tablet (5 mg total) by mouth daily as needed for moderate constipation. 04/03/14   Charolette Forward, MD  donepezil (ARICEPT) 10 MG tablet Take 10 mg by mouth at bedtime.    Historical Provider, MD  ferrous sulfate 325 (65 FE) MG tablet Take 1 tablet (325 mg total) by mouth 3 (three) times daily with meals. 04/03/14   Charolette Forward, MD  haloperidol (HALDOL) 5 MG tablet Take 5-10 mg by mouth at bedtime.    Historical Provider, MD  metoprolol tartrate (LOPRESSOR) 12.5 mg  TABS tablet Take 0.5 tablets (12.5 mg total) by mouth 2 (two) times daily. 04/03/14   Charolette Forward, MD  Multiple Vitamins-Minerals (MULTIVITAL) tablet Take 1 tablet by mouth daily.    Historical Provider, MD  nitroGLYCERIN (NITROSTAT) 0.4 MG SL tablet Place 1 tablet (0.4 mg total) under the tongue every 5 (five) minutes x 3 doses as needed for chest pain. 04/03/14   Charolette Forward, MD  Omega-3 Fatty Acids (FISH OIL) 1000 MG CAPS Take 1 capsule by mouth daily.    Historical Provider, MD  sertraline (ZOLOFT) 50 MG tablet Take 1 tablet (50 mg total) by mouth daily. 10/28/11   Venia Carbon, MD  Tamsulosin HCl (FLOMAX) 0.4 MG CAPS Take 1 capsule (0.4 mg total) by mouth daily. 10/28/11   Venia Carbon, MD  ticagrelor (BRILINTA) 90 MG TABS tablet Take 1  tablet (90 mg total) by mouth 2 (two) times daily. 04/03/14   Charolette Forward, MD   BP 107/54 mmHg  Pulse 96  Temp(Src) 97.8 F (36.6 C) (Oral)  Resp 22  Ht 6\' 1"  (1.854 m)  Wt 140 lb (63.504 kg)  BMI 18.47 kg/m2  SpO2 92% Physical Exam  Constitutional: He is oriented to person, place, and time.  Demented, slight cough   HENT:  Head: Normocephalic.  Mouth/Throat: Oropharynx is clear and moist.  Eyes: Conjunctivae are normal. Pupils are equal, round, and reactive to light.  Neck: Normal range of motion. Neck supple.  Cardiovascular: Normal rate, regular rhythm and normal heart sounds.   Pulmonary/Chest:  Crackles L base. No obvious wheezing. Slightly tachypneic   Abdominal: Soft. Bowel sounds are normal. He exhibits no distension. There is no tenderness. There is no rebound.  Musculoskeletal: Normal range of motion. He exhibits no edema or tenderness.  Neurological: He is alert and oriented to person, place, and time. No cranial nerve deficit. Coordination normal.  Skin: Skin is warm and dry.  Psychiatric: He has a normal mood and affect. His behavior is normal. Judgment and thought content normal.  Nursing note and vitals reviewed.   ED Course  Procedures (including critical care time) Labs Review Labs Reviewed  CBC WITH DIFFERENTIAL/PLATELET - Abnormal; Notable for the following:    RBC 3.85 (*)    Hemoglobin 11.9 (*)    HCT 35.7 (*)    Platelets 136 (*)    Neutro Abs 8.3 (*)    Lymphs Abs 0.9 (*)    All other components within normal limits  COMPREHENSIVE METABOLIC PANEL - Abnormal; Notable for the following:    Glucose, Bld 131 (*)    BUN 38 (*)    Creatinine, Ser 1.55 (*)    GFR calc non Af Amer 38 (*)    GFR calc Af Amer 44 (*)    All other components within normal limits  CULTURE, BLOOD (ROUTINE X 2)  CULTURE, BLOOD (ROUTINE X 2)  TROPONIN I  LACTIC ACID, PLASMA    Imaging Review Dg Chest 2 View  11/13/2015  CLINICAL DATA:  Cough and congestion for a few  days, history dementia, MI, former smoker EXAM: CHEST  2 VIEW COMPARISON:  08/22/2015 FINDINGS: Upper normal heart size. Atherosclerotic calcification and elongation of thoracic aorta. Mediastinal contours and pulmonary vascularity otherwise normal. Mild LEFT lower lobe infiltrate. Remaining lungs clear. No pleural effusion or pneumothorax. Bones demineralized. IMPRESSION: LEFT lower lobe infiltrate consistent with pneumonia. Aortic atherosclerosis. Electronically Signed   By: Lavonia Dana M.D.   On: 11/13/2015 12:30   I have personally  reviewed and evaluated these images and lab results as part of my medical decision-making.   EKG Interpretation None       ED ECG REPORT I, Leetta Hendriks, the attending physician, personally viewed and interpreted this ECG.   Date: 11/13/2015  EKG Time: 12:27 on  Rate: 89  Rhythm: normal EKG, normal sinus rhythm  Axis: normal  Intervals:none  ST&T Change: nonspecific    MDM   Final diagnoses:  None   [name] Purl Sr. is a 82 y.o. male here with cough. Borderline O2 92% on RA. Not on oxygen at home. Has crackles L base, afebrile. Likely pneumonia. Will get labs, cultures, CXR, lactate. I doubt PE.   1:12 PM WBC nl. Lactate nl. CXR showed L basilar pneumonia. O2 borderline 92% on RA. Still coughing, BP 99 to low 100s. Has acute on chronic renal insufficiency. Given IVF and levaquin. Will admit.   1:37 PM Discussed with Dr. Bridgett Larsson from hospitalist. Patient not hypoxic even with ambulation. He recommend outpatient abx for now. Given nl WBC and nl lactate, doesn't meet criteria for admission. Will dc home with levaquin. Loaded with IV levaquin in the ED.   2:48 PM Vitals remained stable. HR 88, BP 116/66, Pulse ox 99% on RA. Given levaquin. Will dc home with PO levaquin, has PCN allergy.    Wandra Arthurs, MD 11/13/15 [place] Talbot Monarch, MD 11/13/15 [phone]

## 2015-11-18 LAB — CULTURE, BLOOD (ROUTINE X 2)
CULTURE: NO GROWTH
CULTURE: NO GROWTH

## 2016-02-23 DEATH — deceased
